# Patient Record
Sex: Female | Born: 1945
Health system: Southern US, Community
[De-identification: ages and names within clinical notes are randomized; demographics above are authoritative.]

## PROBLEM LIST (undated history)

## (undated) DIAGNOSIS — D369 Benign neoplasm, unspecified site: Secondary | ICD-10-CM

## (undated) DIAGNOSIS — F329 Major depressive disorder, single episode, unspecified: Secondary | ICD-10-CM

## (undated) DIAGNOSIS — F32A Depression, unspecified: Secondary | ICD-10-CM

## (undated) DIAGNOSIS — E7800 Pure hypercholesterolemia, unspecified: Secondary | ICD-10-CM

## (undated) DIAGNOSIS — M1990 Unspecified osteoarthritis, unspecified site: Secondary | ICD-10-CM

## (undated) HISTORY — DX: Unspecified osteoarthritis, unspecified site: M19.90

## (undated) HISTORY — DX: Benign neoplasm, unspecified site: D36.9

## (undated) HISTORY — DX: Major depressive disorder, single episode, unspecified: F32.9

## (undated) HISTORY — DX: Pure hypercholesterolemia, unspecified: E78.00

## (undated) HISTORY — DX: Depression, unspecified: F32.A

---

## 2002-02-20 HISTORY — PX: REDUCTION MAMMAPLASTY: SUR839

## 2005-09-25 ENCOUNTER — Encounter: Admission: RE | Admit: 2005-09-25 | Discharge: 2005-09-25 | Payer: Self-pay | Admitting: Geriatric Medicine

## 2006-04-03 ENCOUNTER — Other Ambulatory Visit: Admission: RE | Admit: 2006-04-03 | Discharge: 2006-04-03 | Payer: Self-pay | Admitting: Obstetrics and Gynecology

## 2006-11-15 ENCOUNTER — Encounter: Admission: RE | Admit: 2006-11-15 | Discharge: 2006-11-15 | Payer: Self-pay | Admitting: Geriatric Medicine

## 2007-05-08 ENCOUNTER — Other Ambulatory Visit: Admission: RE | Admit: 2007-05-08 | Discharge: 2007-05-08 | Payer: Self-pay | Admitting: Obstetrics and Gynecology

## 2007-11-18 ENCOUNTER — Encounter: Admission: RE | Admit: 2007-11-18 | Discharge: 2007-11-18 | Payer: Self-pay | Admitting: Geriatric Medicine

## 2008-11-23 ENCOUNTER — Encounter: Admission: RE | Admit: 2008-11-23 | Discharge: 2008-11-23 | Payer: Self-pay | Admitting: Geriatric Medicine

## 2009-04-28 ENCOUNTER — Other Ambulatory Visit
Admission: RE | Admit: 2009-04-28 | Discharge: 2009-04-28 | Payer: Self-pay | Source: Home / Self Care | Admitting: Obstetrics and Gynecology

## 2009-12-09 ENCOUNTER — Encounter: Admission: RE | Admit: 2009-12-09 | Discharge: 2009-12-09 | Payer: Self-pay | Admitting: Geriatric Medicine

## 2010-09-19 ENCOUNTER — Other Ambulatory Visit: Payer: Self-pay | Admitting: Obstetrics and Gynecology

## 2010-09-19 ENCOUNTER — Other Ambulatory Visit (HOSPITAL_COMMUNITY)
Admission: RE | Admit: 2010-09-19 | Discharge: 2010-09-19 | Disposition: A | Discharge status: Home / Self Care | Payer: Medicare Other | Source: Ambulatory Visit | Attending: Obstetrics and Gynecology | Admitting: Obstetrics and Gynecology

## 2010-09-19 DIAGNOSIS — Z124 Encounter for screening for malignant neoplasm of cervix: Secondary | ICD-10-CM | POA: Insufficient documentation

## 2010-11-16 ENCOUNTER — Other Ambulatory Visit: Payer: Self-pay | Admitting: Otolaryngology

## 2010-11-16 DIAGNOSIS — H93299 Other abnormal auditory perceptions, unspecified ear: Secondary | ICD-10-CM

## 2010-11-25 ENCOUNTER — Ambulatory Visit
Admission: RE | Admit: 2010-11-25 | Discharge: 2010-11-25 | Disposition: A | Discharge status: Home / Self Care | Payer: Medicare Other | Source: Ambulatory Visit | Attending: Otolaryngology | Admitting: Otolaryngology

## 2010-11-25 DIAGNOSIS — H93299 Other abnormal auditory perceptions, unspecified ear: Secondary | ICD-10-CM

## 2010-11-25 MED ORDER — GADOBENATE DIMEGLUMINE 529 MG/ML IV SOLN
15.0000 mL | Freq: Once | INTRAVENOUS | Status: AC | PRN
  Administered 2010-11-25: 15 mL via INTRAVENOUS

## 2010-12-08 ENCOUNTER — Other Ambulatory Visit: Payer: Self-pay | Admitting: Dermatology

## 2010-12-19 ENCOUNTER — Other Ambulatory Visit: Payer: Self-pay | Admitting: Geriatric Medicine

## 2010-12-19 DIAGNOSIS — Z1231 Encounter for screening mammogram for malignant neoplasm of breast: Secondary | ICD-10-CM

## 2011-01-25 ENCOUNTER — Ambulatory Visit
Admission: RE | Admit: 2011-01-25 | Discharge: 2011-01-25 | Disposition: A | Discharge status: Home / Self Care | Payer: Medicare Other | Source: Ambulatory Visit | Attending: Geriatric Medicine | Admitting: Geriatric Medicine

## 2011-01-25 DIAGNOSIS — Z1231 Encounter for screening mammogram for malignant neoplasm of breast: Secondary | ICD-10-CM

## 2011-12-20 ENCOUNTER — Other Ambulatory Visit: Payer: Self-pay | Admitting: Geriatric Medicine

## 2011-12-20 DIAGNOSIS — Z1231 Encounter for screening mammogram for malignant neoplasm of breast: Secondary | ICD-10-CM

## 2012-01-26 ENCOUNTER — Ambulatory Visit
Admission: RE | Admit: 2012-01-26 | Discharge: 2012-01-26 | Disposition: A | Discharge status: Home / Self Care | Payer: Medicare Other | Source: Ambulatory Visit | Attending: Geriatric Medicine | Admitting: Geriatric Medicine

## 2012-01-26 DIAGNOSIS — Z1231 Encounter for screening mammogram for malignant neoplasm of breast: Secondary | ICD-10-CM

## 2012-05-02 ENCOUNTER — Other Ambulatory Visit: Payer: Self-pay | Admitting: Dermatology

## 2012-12-11 ENCOUNTER — Other Ambulatory Visit: Payer: Self-pay

## 2012-12-11 DIAGNOSIS — Z1231 Encounter for screening mammogram for malignant neoplasm of breast: Secondary | ICD-10-CM

## 2013-02-06 ENCOUNTER — Ambulatory Visit: Payer: Medicare Other

## 2013-03-20 ENCOUNTER — Ambulatory Visit
Admission: RE | Admit: 2013-03-20 | Discharge: 2013-03-20 | Disposition: A | Discharge status: Home / Self Care | Payer: Medicare Other | Source: Ambulatory Visit

## 2013-03-20 DIAGNOSIS — Z1231 Encounter for screening mammogram for malignant neoplasm of breast: Secondary | ICD-10-CM

## 2014-02-26 ENCOUNTER — Other Ambulatory Visit: Payer: Self-pay

## 2014-02-26 DIAGNOSIS — Z1231 Encounter for screening mammogram for malignant neoplasm of breast: Secondary | ICD-10-CM

## 2014-02-26 DIAGNOSIS — Z9889 Other specified postprocedural states: Secondary | ICD-10-CM

## 2014-03-23 ENCOUNTER — Ambulatory Visit
Admission: RE | Admit: 2014-03-23 | Discharge: 2014-03-23 | Disposition: A | Discharge status: Home / Self Care | Payer: Medicare Other | Source: Ambulatory Visit

## 2014-03-23 DIAGNOSIS — Z9889 Other specified postprocedural states: Secondary | ICD-10-CM

## 2014-03-23 DIAGNOSIS — Z1231 Encounter for screening mammogram for malignant neoplasm of breast: Secondary | ICD-10-CM

## 2014-12-16 ENCOUNTER — Other Ambulatory Visit: Payer: Self-pay | Admitting: Geriatric Medicine

## 2014-12-16 ENCOUNTER — Ambulatory Visit
Admission: RE | Admit: 2014-12-16 | Discharge: 2014-12-16 | Disposition: A | Discharge status: Home / Self Care | Payer: Medicare Other | Source: Ambulatory Visit | Attending: Geriatric Medicine | Admitting: Geriatric Medicine

## 2014-12-16 DIAGNOSIS — M25552 Pain in left hip: Secondary | ICD-10-CM

## 2014-12-16 DIAGNOSIS — M25551 Pain in right hip: Secondary | ICD-10-CM

## 2015-02-16 ENCOUNTER — Other Ambulatory Visit: Payer: Self-pay

## 2015-02-16 DIAGNOSIS — Z1231 Encounter for screening mammogram for malignant neoplasm of breast: Secondary | ICD-10-CM

## 2015-03-24 DIAGNOSIS — E7800 Pure hypercholesterolemia, unspecified: Secondary | ICD-10-CM | POA: Diagnosis not present

## 2015-03-24 DIAGNOSIS — M25512 Pain in left shoulder: Secondary | ICD-10-CM | POA: Diagnosis not present

## 2015-03-24 DIAGNOSIS — F325 Major depressive disorder, single episode, in full remission: Secondary | ICD-10-CM | POA: Diagnosis not present

## 2015-03-25 ENCOUNTER — Ambulatory Visit
Admission: RE | Admit: 2015-03-25 | Discharge: 2015-03-25 | Disposition: A | Discharge status: Home / Self Care | Payer: Medicare Other | Source: Ambulatory Visit

## 2015-03-25 DIAGNOSIS — Z1231 Encounter for screening mammogram for malignant neoplasm of breast: Secondary | ICD-10-CM

## 2015-03-25 DIAGNOSIS — M25512 Pain in left shoulder: Secondary | ICD-10-CM | POA: Diagnosis not present

## 2015-04-01 DIAGNOSIS — M25512 Pain in left shoulder: Secondary | ICD-10-CM | POA: Diagnosis not present

## 2015-04-07 DIAGNOSIS — M7552 Bursitis of left shoulder: Secondary | ICD-10-CM | POA: Diagnosis not present

## 2015-04-07 DIAGNOSIS — M542 Cervicalgia: Secondary | ICD-10-CM | POA: Diagnosis not present

## 2015-04-12 DIAGNOSIS — M2560 Stiffness of unspecified joint, not elsewhere classified: Secondary | ICD-10-CM | POA: Diagnosis not present

## 2015-04-12 DIAGNOSIS — M25512 Pain in left shoulder: Secondary | ICD-10-CM | POA: Diagnosis not present

## 2015-04-12 DIAGNOSIS — M25612 Stiffness of left shoulder, not elsewhere classified: Secondary | ICD-10-CM | POA: Diagnosis not present

## 2015-04-12 DIAGNOSIS — M6281 Muscle weakness (generalized): Secondary | ICD-10-CM | POA: Diagnosis not present

## 2015-04-15 DIAGNOSIS — M2560 Stiffness of unspecified joint, not elsewhere classified: Secondary | ICD-10-CM | POA: Diagnosis not present

## 2015-04-15 DIAGNOSIS — M25512 Pain in left shoulder: Secondary | ICD-10-CM | POA: Diagnosis not present

## 2015-04-15 DIAGNOSIS — M6281 Muscle weakness (generalized): Secondary | ICD-10-CM | POA: Diagnosis not present

## 2015-04-15 DIAGNOSIS — M25612 Stiffness of left shoulder, not elsewhere classified: Secondary | ICD-10-CM | POA: Diagnosis not present

## 2015-04-20 DIAGNOSIS — M6281 Muscle weakness (generalized): Secondary | ICD-10-CM | POA: Diagnosis not present

## 2015-04-20 DIAGNOSIS — M25512 Pain in left shoulder: Secondary | ICD-10-CM | POA: Diagnosis not present

## 2015-04-20 DIAGNOSIS — M25612 Stiffness of left shoulder, not elsewhere classified: Secondary | ICD-10-CM | POA: Diagnosis not present

## 2015-04-20 DIAGNOSIS — M2560 Stiffness of unspecified joint, not elsewhere classified: Secondary | ICD-10-CM | POA: Diagnosis not present

## 2015-04-22 DIAGNOSIS — M25612 Stiffness of left shoulder, not elsewhere classified: Secondary | ICD-10-CM | POA: Diagnosis not present

## 2015-04-22 DIAGNOSIS — M25512 Pain in left shoulder: Secondary | ICD-10-CM | POA: Diagnosis not present

## 2015-04-22 DIAGNOSIS — M2560 Stiffness of unspecified joint, not elsewhere classified: Secondary | ICD-10-CM | POA: Diagnosis not present

## 2015-04-22 DIAGNOSIS — M6281 Muscle weakness (generalized): Secondary | ICD-10-CM | POA: Diagnosis not present

## 2015-05-17 DIAGNOSIS — Z85828 Personal history of other malignant neoplasm of skin: Secondary | ICD-10-CM | POA: Diagnosis not present

## 2015-05-17 DIAGNOSIS — L820 Inflamed seborrheic keratosis: Secondary | ICD-10-CM | POA: Diagnosis not present

## 2015-05-19 DIAGNOSIS — M7542 Impingement syndrome of left shoulder: Secondary | ICD-10-CM | POA: Diagnosis not present

## 2015-05-19 DIAGNOSIS — M542 Cervicalgia: Secondary | ICD-10-CM | POA: Diagnosis not present

## 2015-10-11 DIAGNOSIS — L570 Actinic keratosis: Secondary | ICD-10-CM | POA: Diagnosis not present

## 2015-10-11 DIAGNOSIS — L821 Other seborrheic keratosis: Secondary | ICD-10-CM | POA: Diagnosis not present

## 2015-12-29 DIAGNOSIS — Z79899 Other long term (current) drug therapy: Secondary | ICD-10-CM | POA: Diagnosis not present

## 2015-12-29 DIAGNOSIS — Z23 Encounter for immunization: Secondary | ICD-10-CM | POA: Diagnosis not present

## 2015-12-29 DIAGNOSIS — E7800 Pure hypercholesterolemia, unspecified: Secondary | ICD-10-CM | POA: Diagnosis not present

## 2015-12-29 DIAGNOSIS — G2581 Restless legs syndrome: Secondary | ICD-10-CM | POA: Diagnosis not present

## 2015-12-29 DIAGNOSIS — F325 Major depressive disorder, single episode, in full remission: Secondary | ICD-10-CM | POA: Diagnosis not present

## 2015-12-29 DIAGNOSIS — Z0000 Encounter for general adult medical examination without abnormal findings: Secondary | ICD-10-CM | POA: Diagnosis not present

## 2016-02-23 ENCOUNTER — Other Ambulatory Visit: Payer: Self-pay | Admitting: Geriatric Medicine

## 2016-02-23 DIAGNOSIS — Z9889 Other specified postprocedural states: Secondary | ICD-10-CM

## 2016-02-23 DIAGNOSIS — Z1231 Encounter for screening mammogram for malignant neoplasm of breast: Secondary | ICD-10-CM

## 2016-03-03 DIAGNOSIS — H40053 Ocular hypertension, bilateral: Secondary | ICD-10-CM | POA: Diagnosis not present

## 2016-03-03 DIAGNOSIS — H2513 Age-related nuclear cataract, bilateral: Secondary | ICD-10-CM | POA: Diagnosis not present

## 2016-03-30 ENCOUNTER — Ambulatory Visit
Admission: RE | Admit: 2016-03-30 | Discharge: 2016-03-30 | Disposition: A | Discharge status: Home / Self Care | Payer: Medicare Other | Source: Ambulatory Visit | Attending: Geriatric Medicine | Admitting: Geriatric Medicine

## 2016-03-30 ENCOUNTER — Encounter: Payer: Self-pay | Admitting: Radiology

## 2016-03-30 DIAGNOSIS — Z9889 Other specified postprocedural states: Secondary | ICD-10-CM

## 2016-03-30 DIAGNOSIS — Z1231 Encounter for screening mammogram for malignant neoplasm of breast: Secondary | ICD-10-CM

## 2016-05-16 DIAGNOSIS — L720 Epidermal cyst: Secondary | ICD-10-CM | POA: Diagnosis not present

## 2016-05-16 DIAGNOSIS — L821 Other seborrheic keratosis: Secondary | ICD-10-CM | POA: Diagnosis not present

## 2016-05-16 DIAGNOSIS — D225 Melanocytic nevi of trunk: Secondary | ICD-10-CM | POA: Diagnosis not present

## 2016-05-16 DIAGNOSIS — L814 Other melanin hyperpigmentation: Secondary | ICD-10-CM | POA: Diagnosis not present

## 2016-07-05 DIAGNOSIS — L814 Other melanin hyperpigmentation: Secondary | ICD-10-CM | POA: Diagnosis not present

## 2016-07-05 DIAGNOSIS — L821 Other seborrheic keratosis: Secondary | ICD-10-CM | POA: Diagnosis not present

## 2016-07-05 DIAGNOSIS — L820 Inflamed seborrheic keratosis: Secondary | ICD-10-CM | POA: Diagnosis not present

## 2016-07-05 DIAGNOSIS — Z85828 Personal history of other malignant neoplasm of skin: Secondary | ICD-10-CM | POA: Diagnosis not present

## 2016-12-07 DIAGNOSIS — Z23 Encounter for immunization: Secondary | ICD-10-CM | POA: Diagnosis not present

## 2017-01-08 DIAGNOSIS — G2581 Restless legs syndrome: Secondary | ICD-10-CM | POA: Diagnosis not present

## 2017-01-08 DIAGNOSIS — F325 Major depressive disorder, single episode, in full remission: Secondary | ICD-10-CM | POA: Diagnosis not present

## 2017-01-08 DIAGNOSIS — E7800 Pure hypercholesterolemia, unspecified: Secondary | ICD-10-CM | POA: Diagnosis not present

## 2017-01-08 DIAGNOSIS — Z0000 Encounter for general adult medical examination without abnormal findings: Secondary | ICD-10-CM | POA: Diagnosis not present

## 2017-01-31 ENCOUNTER — Other Ambulatory Visit: Payer: Self-pay | Admitting: Geriatric Medicine

## 2017-01-31 DIAGNOSIS — Z1231 Encounter for screening mammogram for malignant neoplasm of breast: Secondary | ICD-10-CM

## 2017-02-20 DIAGNOSIS — D369 Benign neoplasm, unspecified site: Secondary | ICD-10-CM

## 2017-02-20 HISTORY — DX: Benign neoplasm, unspecified site: D36.9

## 2017-02-28 DIAGNOSIS — Z1211 Encounter for screening for malignant neoplasm of colon: Secondary | ICD-10-CM | POA: Diagnosis not present

## 2017-02-28 DIAGNOSIS — K5730 Diverticulosis of large intestine without perforation or abscess without bleeding: Secondary | ICD-10-CM | POA: Diagnosis not present

## 2017-02-28 DIAGNOSIS — D126 Benign neoplasm of colon, unspecified: Secondary | ICD-10-CM | POA: Diagnosis not present

## 2017-03-05 DIAGNOSIS — H2513 Age-related nuclear cataract, bilateral: Secondary | ICD-10-CM | POA: Diagnosis not present

## 2017-03-05 DIAGNOSIS — H40053 Ocular hypertension, bilateral: Secondary | ICD-10-CM | POA: Diagnosis not present

## 2017-03-06 DIAGNOSIS — Z1211 Encounter for screening for malignant neoplasm of colon: Secondary | ICD-10-CM | POA: Diagnosis not present

## 2017-03-06 DIAGNOSIS — D126 Benign neoplasm of colon, unspecified: Secondary | ICD-10-CM | POA: Diagnosis not present

## 2017-04-03 ENCOUNTER — Ambulatory Visit
Admission: RE | Admit: 2017-04-03 | Discharge: 2017-04-03 | Disposition: A | Discharge status: Home / Self Care | Payer: Medicare Other | Source: Ambulatory Visit | Attending: Geriatric Medicine | Admitting: Geriatric Medicine

## 2017-04-03 DIAGNOSIS — Z1231 Encounter for screening mammogram for malignant neoplasm of breast: Secondary | ICD-10-CM | POA: Diagnosis not present

## 2017-05-01 DIAGNOSIS — H25811 Combined forms of age-related cataract, right eye: Secondary | ICD-10-CM | POA: Diagnosis not present

## 2017-05-01 DIAGNOSIS — H2511 Age-related nuclear cataract, right eye: Secondary | ICD-10-CM | POA: Diagnosis not present

## 2017-05-22 DIAGNOSIS — R001 Bradycardia, unspecified: Secondary | ICD-10-CM | POA: Diagnosis not present

## 2017-05-22 DIAGNOSIS — F325 Major depressive disorder, single episode, in full remission: Secondary | ICD-10-CM | POA: Diagnosis not present

## 2017-05-22 DIAGNOSIS — Z79899 Other long term (current) drug therapy: Secondary | ICD-10-CM | POA: Diagnosis not present

## 2017-05-22 DIAGNOSIS — R9431 Abnormal electrocardiogram [ECG] [EKG]: Secondary | ICD-10-CM | POA: Diagnosis not present

## 2017-06-22 DIAGNOSIS — F325 Major depressive disorder, single episode, in full remission: Secondary | ICD-10-CM | POA: Diagnosis not present

## 2017-06-22 DIAGNOSIS — R001 Bradycardia, unspecified: Secondary | ICD-10-CM | POA: Diagnosis not present

## 2017-06-22 DIAGNOSIS — R9431 Abnormal electrocardiogram [ECG] [EKG]: Secondary | ICD-10-CM | POA: Diagnosis not present

## 2017-06-25 DIAGNOSIS — R002 Palpitations: Secondary | ICD-10-CM | POA: Diagnosis not present

## 2017-06-25 DIAGNOSIS — R9431 Abnormal electrocardiogram [ECG] [EKG]: Secondary | ICD-10-CM | POA: Diagnosis not present

## 2017-06-29 DIAGNOSIS — Z85828 Personal history of other malignant neoplasm of skin: Secondary | ICD-10-CM | POA: Diagnosis not present

## 2017-06-29 DIAGNOSIS — L80 Vitiligo: Secondary | ICD-10-CM | POA: Diagnosis not present

## 2017-06-29 DIAGNOSIS — L570 Actinic keratosis: Secondary | ICD-10-CM | POA: Diagnosis not present

## 2017-07-10 DIAGNOSIS — H25812 Combined forms of age-related cataract, left eye: Secondary | ICD-10-CM | POA: Diagnosis not present

## 2017-07-10 DIAGNOSIS — H2512 Age-related nuclear cataract, left eye: Secondary | ICD-10-CM | POA: Diagnosis not present

## 2017-07-17 DIAGNOSIS — R001 Bradycardia, unspecified: Secondary | ICD-10-CM | POA: Diagnosis not present

## 2017-07-17 DIAGNOSIS — I471 Supraventricular tachycardia: Secondary | ICD-10-CM | POA: Diagnosis not present

## 2017-07-31 ENCOUNTER — Encounter: Payer: Self-pay | Admitting: Internal Medicine

## 2017-07-31 ENCOUNTER — Ambulatory Visit: Payer: Medicare Other | Admitting: Internal Medicine

## 2017-07-31 ENCOUNTER — Encounter (INDEPENDENT_AMBULATORY_CARE_PROVIDER_SITE_OTHER): Payer: Self-pay

## 2017-07-31 VITALS — BP 132/90 | HR 76 | Ht 67.0 in | Wt 167.0 lb

## 2017-07-31 DIAGNOSIS — R55 Syncope and collapse: Secondary | ICD-10-CM | POA: Diagnosis not present

## 2017-07-31 DIAGNOSIS — I472 Ventricular tachycardia: Secondary | ICD-10-CM

## 2017-07-31 DIAGNOSIS — R001 Bradycardia, unspecified: Secondary | ICD-10-CM

## 2017-07-31 DIAGNOSIS — I4729 Other ventricular tachycardia: Secondary | ICD-10-CM

## 2017-07-31 NOTE — Patient Instructions (Signed)
Medication Instructions:  Your physician recommends that you continue on your current medications as directed. Please refer to the Current Medication list given to you today.  Labwork: Pre procedure labwork on 6/17  Testing/Procedures: Your physician has recommended that you have a pacemaker inserted. A pacemaker is a small device that is placed under the skin of your chest or abdomen to help control abnormal heart rhythms. This device uses electrical pulses to prompt the heart to beat at a normal rate. Pacemakers are used to treat heart rhythms that are too slow. Wire (leads) are attached to the pacemaker that goes into the chambers of you heart. This is done in the hospital and usually requires and overnight stay. Please see the instruction sheet given to you today for more information.  Follow-Up: Your physician recommends that you schedule a follow-up appointment in:   10-14 days after implant for wound check 91 days after implant for follow up with Dr Caryl Comes  Any Other Special Instructions Will Be Listed Below (If Applicable).     If you need a refill on your cardiac medications before your next appointment, please call your pharmacy.

## 2017-07-31 NOTE — Progress Notes (Signed)
ELECTROPHYSIOLOGY CONSULT NOTE  Patient ID: Joanna Haas, MRN: [id number], DOB/AGE: 72-Oct-1947 72 y.o. Admit date: (Not on file) Date of Consult: 07/31/2017  Primary Physician: Lajean Manes, MD Primary Cardiologist: new     Joanna Haas is a 72 y.o. female who is being seen today for the evaluation of bradycardia at the request of HStoneking.    HPI Joanna Haas is a 72 y.o. female  Referred because of an abnormal event recorder.  She has a history of recurrent syncope dating back to when she was a girl.  The first occurred during the Christmas play, the second occurred when she was 14 with a prolonged prodrome.  Both of these were associated with diaphoresis and flushing in the recovery phase.  More recently, about 13 years ago she had an episode of syncope at a restaurant with a prolonged prodrome also.  There is also prolonged recovery.  There is no family history of sudden death.  No family history of abrupt onset offset syncope.  She has had problems with orthostatic lightheadedness and some exercise intolerance.  Her max heart rate on her event recorder was 110 bpm        Past Medical History:  Diagnosis Date  . Adenomatous polyp 02/2017   Dr. Paulita Fujita  . Arthritis   . Depression   . Hypercholesterolemia    goal LDL less the 160      Surgical History:  Past Surgical History:  Procedure Laterality Date  . REDUCTION MAMMAPLASTY Bilateral 2004     Home Meds: Prior to Admission medications   Medication Sig Start Date End Date Taking? Authorizing Provider  atorvastatin (LIPITOR) 20 MG tablet Take 20 mg by mouth daily.   Yes [provider]  ibuprofen (ADVIL,MOTRIN) 200 MG tablet Take 200 mg by mouth every 6 (six) hours as needed for headache, mild pain, moderate pain or cramping.   Yes [provider]  ranitidine (ZANTAC) 75 MG tablet Take 75 mg by mouth as needed for heartburn.   Yes [provider]    Allergies:  Allergies    Allergen Reactions  . Calcium + Vitamin D3 [Calcium Carb-Cholecalciferol] Other (See Comments)    Upset stomach  . Citalopram Hydrobromide Other (See Comments)    QT prolonged  . Simvastatin Other (See Comments)    Restless legs    Social History   Socioeconomic History  . Marital status: Married    Spouse name: Not on file  . Number of children: 2  . Years of education: Not on file  . Highest education level: Not on file  Occupational History  . Occupation: Reired  Scientific laboratory technician  . Financial resource strain: Not on file  . Food insecurity:    Worry: Not on file    Inability: Not on file  . Transportation needs:    Medical: Not on file    Non-medical: Not on file  Tobacco Use  . Smoking status: Former Smoker    Last attempt to quit: 1974    Years since quitting: 45.4  . Smokeless tobacco: Never Used  Substance and Sexual Activity  . Alcohol use: Yes    Comment: Glass of wine in evening  . Drug use: Not on file  . Sexual activity: Not on file  Lifestyle  . Physical activity:    Days per week: Not on file    Minutes per session: Not on file  . Stress: Not on file  Relationships  .  Social connections:    Talks on phone: Not on file    Gets together: Not on file    Attends religious service: Not on file    Active member of club or organization: Not on file    Attends meetings of clubs or organizations: Not on file    Relationship status: Not on file  . Intimate partner violence:    Fear of current or ex partner: Not on file    Emotionally abused: Not on file    Physically abused: Not on file    Forced sexual activity: Not on file  Other Topics Concern  . Not on file  Social History Narrative   Moved to [place] from [place], [place].   2 daughters in [place]      Family History  Problem Relation Age of Onset  . Cancer Mother   . Cancer Father   . Cancer Sister   . Cancer Brother      ROS:  Please see the history of present illness.     All other  systems reviewed and negative.    Physical Exam: Blood pressure 132/90, pulse 76, height 5\' 7"  (1.702 m), weight 167 lb (75.8 kg), SpO2 98 %. General: Well developed, well nourished female in no acute distress. Head: Normocephalic, atraumatic, sclera non-icteric, no xanthomas, nares are without discharge. EENT: normal  Lymph Nodes:  none Neck: Negative for carotid bruits. JVD not elevated. Back:without scoliosis kyphosis Lungs: Clear bilaterally to auscultation without wheezes, rales, or rhonchi. Breathing is unlabored. Heart: Slow but RRR with S1 S2. No murmur . No rubs, or gallops appreciated. Abdomen: Soft, non-tender, non-distended with normoactive bowel sounds. No hepatomegaly. No rebound/guarding. No obvious abdominal masses. Msk:  Strength and tone appear normal for age. Extremities: No clubbing or cyanosis. No edema.  Distal pedal pulses are 2+ and equal bilaterally. Skin: Warm and Dry Neuro: Alert and oriented X 3. CN III-XII intact Grossly normal sensory and motor function . Psych:  Responds to questions appropriately with a normal affect.      Labs: Cardiac Enzymes No results for input(s): CKTOTAL, CKMB, TROPONINI in the last 72 hours. CBC No results found for: WBC, HGB, HCT, MCV, PLT PROTIME: No results for input(s): LABPROT, INR in the last 72 hours. Chemistry No results for input(s): NA, K, CL, CO2, BUN, CREATININE, CALCIUM, PROT, BILITOT, ALKPHOS, ALT, AST, GLUCOSE in the last 168 hours.  Invalid input(s): LABALBU Lipids No results found for: CHOL, HDL, LDLCALC, TRIG BNP No results found for: PROBNP Thyroid Function Tests: No results for input(s): TSH, T4TOTAL, T3FREE, THYROIDAB in the last 72 hours.  Invalid input(s): FREET3 Miscellaneous No results found for: DDIMER  Radiology/Studies:  No results found.  EKG: Sinus rhythm at 52 Interval 17/11/48  Event recorder was personally reviewed.  Demonstrated nonsustained ventricular tachycardia-monomorphic  atrial tachycardia.  Maximal heart rate was 110 minimum heart rate was 25.  There are episodes of sinus arrest.  There was also a sequence of polymorphic triplet that was read by ZIO as SVT and initially by me as nonsustained VT.  There is a double deflection in the preceding T wave prior to the triplet.  I suspect this was interpreted by ZIO as the P wave conducting and with aberration.  This may well be correct.  It is complicated however in that the QRS complex Has no preceding P wave in the beat before which has no preceding P waves and describes a P wave in the ST segment i.e. retrograde.  This could give rise to the double deflection noted in the T wave.  Assessment and Plan:  Sinus node dysfunction  Ventricular tachycardia nonsustained-monomorphic  Tachycardia nonsustained-polymorphic possibly ventricular but probably supraventricular  Syncope-neurally mediated  Orthostatic intolerance  Dyspnea on exertion   The patient has symptomatic sinus node dysfunction with profound bradycardia with heart rates into the mid 20s further with evidence of sinus arrest.  She has nonsustained ventricular tachycardia which is monomorphic and not pause dependent.  She has a polymorphic triplet as outlined above in the context of a pause concerning for early afterdepolarizations but probably supraventricular in origin.  We discussed strategies for looking at QT prolongation is an issue.  First and foremost is noted that there is no family history.  She has had abnormal QTs by report but with her bradycardia I do not know whether these were QTCs or QTs.  With her bradycardia which is progressive and exercise intolerance which she is also noted, we have elected to proceed with pacing for relief of symptomatic bradycardia.  The benefits and risks were reviewed including but not limited to death,  perforation, infection, lead dislodgement and device malfunction.  The patient understands agrees and is willing  to proceed.  We will check an echocardiogram given her ventricular tachycardia        Virl Axe

## 2017-07-31 NOTE — H&P (View-Only) (Signed)
ELECTROPHYSIOLOGY CONSULT NOTE  Patient ID: Joanna Haas, MRN: [id number], DOB/AGE: Jul 14, 1945 72 y.o. Admit date: (Not on file) Date of Consult: 07/31/2017  Primary Physician: Lajean Manes, MD Primary Cardiologist: new     CIENA Haas is a 72 y.o. female who is being seen today for the evaluation of bradycardia at the request of HStoneking.    HPI ROCHELLE Haas is a 72 y.o. female  Referred because of an abnormal event recorder.  She has a history of recurrent syncope dating back to when she was a girl.  The first occurred during the Christmas play, the second occurred when she was 14 with a prolonged prodrome.  Both of these were associated with diaphoresis and flushing in the recovery phase.  More recently, about 13 years ago she had an episode of syncope at a restaurant with a prolonged prodrome also.  There is also prolonged recovery.  There is no family history of sudden death.  No family history of abrupt onset offset syncope.  She has had problems with orthostatic lightheadedness and some exercise intolerance.  Her max heart rate on her event recorder was 110 bpm        Past Medical History:  Diagnosis Date  . Adenomatous polyp 02/2017   Dr. Paulita Fujita  . Arthritis   . Depression   . Hypercholesterolemia    goal LDL less the 160      Surgical History:  Past Surgical History:  Procedure Laterality Date  . REDUCTION MAMMAPLASTY Bilateral 2004     Home Meds: Prior to Admission medications   Medication Sig Start Date End Date Taking? Authorizing Provider  atorvastatin (LIPITOR) 20 MG tablet Take 20 mg by mouth daily.   Yes [provider]  ibuprofen (ADVIL,MOTRIN) 200 MG tablet Take 200 mg by mouth every 6 (six) hours as needed for headache, mild pain, moderate pain or cramping.   Yes [provider]  ranitidine (ZANTAC) 75 MG tablet Take 75 mg by mouth as needed for heartburn.   Yes [provider]    Allergies:  Allergies    Allergen Reactions  . Calcium + Vitamin D3 [Calcium Carb-Cholecalciferol] Other (See Comments)    Upset stomach  . Citalopram Hydrobromide Other (See Comments)    QT prolonged  . Simvastatin Other (See Comments)    Restless legs    Social History   Socioeconomic History  . Marital status: Married    Spouse name: Not on file  . Number of children: 2  . Years of education: Not on file  . Highest education level: Not on file  Occupational History  . Occupation: Reired  Scientific laboratory technician  . Financial resource strain: Not on file  . Food insecurity:    Worry: Not on file    Inability: Not on file  . Transportation needs:    Medical: Not on file    Non-medical: Not on file  Tobacco Use  . Smoking status: Former Smoker    Last attempt to quit: 1974    Years since quitting: 45.4  . Smokeless tobacco: Never Used  Substance and Sexual Activity  . Alcohol use: Yes    Comment: Glass of wine in evening  . Drug use: Not on file  . Sexual activity: Not on file  Lifestyle  . Physical activity:    Days per week: Not on file    Minutes per session: Not on file  . Stress: Not on file  Relationships  .  Social connections:    Talks on phone: Not on file    Gets together: Not on file    Attends religious service: Not on file    Active member of club or organization: Not on file    Attends meetings of clubs or organizations: Not on file    Relationship status: Not on file  . Intimate partner violence:    Fear of current or ex partner: Not on file    Emotionally abused: Not on file    Physically abused: Not on file    Forced sexual activity: Not on file  Other Topics Concern  . Not on file  Social History Narrative   Moved to [place] from [place], [place].   2 daughters in [place]      Family History  Problem Relation Age of Onset  . Cancer Mother   . Cancer Father   . Cancer Sister   . Cancer Brother      ROS:  Please see the history of present illness.     All other  systems reviewed and negative.    Physical Exam: Blood pressure 132/90, pulse 76, height 5\' 7"  (1.702 m), weight 167 lb (75.8 kg), SpO2 98 %. General: Well developed, well nourished female in no acute distress. Head: Normocephalic, atraumatic, sclera non-icteric, no xanthomas, nares are without discharge. EENT: normal  Lymph Nodes:  none Neck: Negative for carotid bruits. JVD not elevated. Back:without scoliosis kyphosis Lungs: Clear bilaterally to auscultation without wheezes, rales, or rhonchi. Breathing is unlabored. Heart: Slow but RRR with S1 S2. No murmur . No rubs, or gallops appreciated. Abdomen: Soft, non-tender, non-distended with normoactive bowel sounds. No hepatomegaly. No rebound/guarding. No obvious abdominal masses. Msk:  Strength and tone appear normal for age. Extremities: No clubbing or cyanosis. No edema.  Distal pedal pulses are 2+ and equal bilaterally. Skin: Warm and Dry Neuro: Alert and oriented X 3. CN III-XII intact Grossly normal sensory and motor function . Psych:  Responds to questions appropriately with a normal affect.      Labs: Cardiac Enzymes No results for input(s): CKTOTAL, CKMB, TROPONINI in the last 72 hours. CBC No results found for: WBC, HGB, HCT, MCV, PLT PROTIME: No results for input(s): LABPROT, INR in the last 72 hours. Chemistry No results for input(s): NA, K, CL, CO2, BUN, CREATININE, CALCIUM, PROT, BILITOT, ALKPHOS, ALT, AST, GLUCOSE in the last 168 hours.  Invalid input(s): LABALBU Lipids No results found for: CHOL, HDL, LDLCALC, TRIG BNP No results found for: PROBNP Thyroid Function Tests: No results for input(s): TSH, T4TOTAL, T3FREE, THYROIDAB in the last 72 hours.  Invalid input(s): FREET3 Miscellaneous No results found for: DDIMER  Radiology/Studies:  No results found.  EKG: Sinus rhythm at 52 Interval 17/11/48  Event recorder was personally reviewed.  Demonstrated nonsustained ventricular tachycardia-monomorphic  atrial tachycardia.  Maximal heart rate was 110 minimum heart rate was 25.  There are episodes of sinus arrest.  There was also a sequence of polymorphic triplet that was read by ZIO as SVT and initially by me as nonsustained VT.  There is a double deflection in the preceding T wave prior to the triplet.  I suspect this was interpreted by ZIO as the P wave conducting and with aberration.  This may well be correct.  It is complicated however in that the QRS complex Has no preceding P wave in the beat before which has no preceding P waves and describes a P wave in the ST segment i.e. retrograde.  This could give rise to the double deflection noted in the T wave.  Assessment and Plan:  Sinus node dysfunction  Ventricular tachycardia nonsustained-monomorphic  Tachycardia nonsustained-polymorphic possibly ventricular but probably supraventricular  Syncope-neurally mediated  Orthostatic intolerance  Dyspnea on exertion   The patient has symptomatic sinus node dysfunction with profound bradycardia with heart rates into the mid 20s further with evidence of sinus arrest.  She has nonsustained ventricular tachycardia which is monomorphic and not pause dependent.  She has a polymorphic triplet as outlined above in the context of a pause concerning for early afterdepolarizations but probably supraventricular in origin.  We discussed strategies for looking at QT prolongation is an issue.  First and foremost is noted that there is no family history.  She has had abnormal QTs by report but with her bradycardia I do not know whether these were QTCs or QTs.  With her bradycardia which is progressive and exercise intolerance which she is also noted, we have elected to proceed with pacing for relief of symptomatic bradycardia.  The benefits and risks were reviewed including but not limited to death,  perforation, infection, lead dislodgement and device malfunction.  The patient understands agrees and is willing  to proceed.  We will check an echocardiogram given her ventricular tachycardia        Virl Axe

## 2017-08-01 DIAGNOSIS — R001 Bradycardia, unspecified: Secondary | ICD-10-CM

## 2017-08-01 NOTE — Addendum Note (Signed)
Addended by: Dollene Primrose on: 08/01/2017 07:55 AM   Modules accepted: Orders

## 2017-08-03 ENCOUNTER — Other Ambulatory Visit: Payer: Self-pay

## 2017-08-03 ENCOUNTER — Ambulatory Visit (HOSPITAL_COMMUNITY): Payer: Medicare Other | Attending: Interventional Cardiology

## 2017-08-03 ENCOUNTER — Telehealth: Payer: Self-pay

## 2017-08-03 DIAGNOSIS — I340 Nonrheumatic mitral (valve) insufficiency: Secondary | ICD-10-CM | POA: Diagnosis not present

## 2017-08-03 DIAGNOSIS — R001 Bradycardia, unspecified: Secondary | ICD-10-CM | POA: Insufficient documentation

## 2017-08-03 DIAGNOSIS — R55 Syncope and collapse: Secondary | ICD-10-CM | POA: Diagnosis not present

## 2017-08-03 DIAGNOSIS — E785 Hyperlipidemia, unspecified: Secondary | ICD-10-CM | POA: Insufficient documentation

## 2017-08-03 DIAGNOSIS — Z87891 Personal history of nicotine dependence: Secondary | ICD-10-CM | POA: Diagnosis not present

## 2017-08-03 DIAGNOSIS — I472 Ventricular tachycardia: Secondary | ICD-10-CM | POA: Diagnosis not present

## 2017-08-03 DIAGNOSIS — I4729 Other ventricular tachycardia: Secondary | ICD-10-CM

## 2017-08-03 NOTE — Telephone Encounter (Signed)
Pt informed of her procedure time changing to 12:00pm arrival with 14:00pm procedure. Pt understands she may have a light breakfast before 7:30am. She had no additional questions.

## 2017-08-06 ENCOUNTER — Other Ambulatory Visit: Payer: Self-pay | Admitting: Internal Medicine

## 2017-08-06 ENCOUNTER — Other Ambulatory Visit: Payer: Medicare Other | Admitting: *Deleted

## 2017-08-06 LAB — CBC WITH DIFFERENTIAL/PLATELET
Basophils Absolute: 0 10*3/uL (ref 0.0–0.2)
Basos: 0 %
EOS (ABSOLUTE): 0.1 10*3/uL (ref 0.0–0.4)
EOS: 2 %
HEMATOCRIT: 41.1 % (ref 34.0–46.6)
HEMOGLOBIN: 14.2 g/dL (ref 11.1–15.9)
Immature Grans (Abs): 0 10*3/uL (ref 0.0–0.1)
Immature Granulocytes: 0 %
LYMPHS ABS: 1.7 10*3/uL (ref 0.7–3.1)
Lymphs: 32 %
MCH: 31.8 pg (ref 26.6–33.0)
MCHC: 34.5 g/dL (ref 31.5–35.7)
MCV: 92 fL (ref 79–97)
MONOCYTES: 9 %
Monocytes Absolute: 0.5 10*3/uL (ref 0.1–0.9)
NEUTROS ABS: 3 10*3/uL (ref 1.4–7.0)
Neutrophils: 57 %
Platelets: 228 10*3/uL (ref 150–450)
RBC: 4.46 x10E6/uL (ref 3.77–5.28)
RDW: 13 % (ref 12.3–15.4)
WBC: 5.2 10*3/uL (ref 3.4–10.8)

## 2017-08-07 LAB — BASIC METABOLIC PANEL
BUN/Creatinine Ratio: 17 (ref 12–28)
BUN: 11 mg/dL (ref 8–27)
CALCIUM: 9.5 mg/dL (ref 8.7–10.3)
CO2: 20 mmol/L (ref 20–29)
CREATININE: 0.65 mg/dL (ref 0.57–1.00)
Chloride: 109 mmol/L — ABNORMAL HIGH (ref 96–106)
GFR calc Af Amer: 103 mL/min/{1.73_m2} (ref >59)
GFR, EST NON AFRICAN AMERICAN: 89 mL/min/{1.73_m2} (ref >59)
Glucose: 87 mg/dL (ref 65–99)
Potassium: 4.5 mmol/L (ref 3.5–5.2)
Sodium: 143 mmol/L (ref 134–144)

## 2017-08-13 ENCOUNTER — Encounter (HOSPITAL_COMMUNITY): Payer: Self-pay | Admitting: *Deleted

## 2017-08-13 ENCOUNTER — Ambulatory Visit (HOSPITAL_COMMUNITY)
Admission: RE | Admit: 2017-08-13 | Discharge: 2017-08-14 | Disposition: A | Discharge status: Home / Self Care | Payer: Medicare Other | Source: Ambulatory Visit | Attending: Internal Medicine | Admitting: Internal Medicine

## 2017-08-13 ENCOUNTER — Other Ambulatory Visit: Payer: Self-pay

## 2017-08-13 ENCOUNTER — Encounter (HOSPITAL_COMMUNITY)
Admission: RE | Disposition: A | Discharge status: Home / Self Care | Payer: Self-pay | Source: Ambulatory Visit | Attending: Internal Medicine

## 2017-08-13 DIAGNOSIS — R0609 Other forms of dyspnea: Secondary | ICD-10-CM | POA: Diagnosis not present

## 2017-08-13 DIAGNOSIS — Z79899 Other long term (current) drug therapy: Secondary | ICD-10-CM | POA: Insufficient documentation

## 2017-08-13 DIAGNOSIS — E7800 Pure hypercholesterolemia, unspecified: Secondary | ICD-10-CM | POA: Insufficient documentation

## 2017-08-13 DIAGNOSIS — I495 Sick sinus syndrome: Secondary | ICD-10-CM | POA: Diagnosis not present

## 2017-08-13 DIAGNOSIS — Z9889 Other specified postprocedural states: Secondary | ICD-10-CM | POA: Diagnosis not present

## 2017-08-13 DIAGNOSIS — Z888 Allergy status to other drugs, medicaments and biological substances status: Secondary | ICD-10-CM | POA: Insufficient documentation

## 2017-08-13 DIAGNOSIS — I472 Ventricular tachycardia: Secondary | ICD-10-CM | POA: Insufficient documentation

## 2017-08-13 DIAGNOSIS — M1990 Unspecified osteoarthritis, unspecified site: Secondary | ICD-10-CM | POA: Insufficient documentation

## 2017-08-13 DIAGNOSIS — R000 Tachycardia, unspecified: Secondary | ICD-10-CM

## 2017-08-13 DIAGNOSIS — R55 Syncope and collapse: Secondary | ICD-10-CM | POA: Diagnosis not present

## 2017-08-13 DIAGNOSIS — Z959 Presence of cardiac and vascular implant and graft, unspecified: Secondary | ICD-10-CM

## 2017-08-13 DIAGNOSIS — F329 Major depressive disorder, single episode, unspecified: Secondary | ICD-10-CM | POA: Insufficient documentation

## 2017-08-13 DIAGNOSIS — Z86010 Personal history of colonic polyps: Secondary | ICD-10-CM | POA: Diagnosis not present

## 2017-08-13 DIAGNOSIS — Z87891 Personal history of nicotine dependence: Secondary | ICD-10-CM | POA: Diagnosis not present

## 2017-08-13 DIAGNOSIS — I455 Other specified heart block: Secondary | ICD-10-CM | POA: Insufficient documentation

## 2017-08-13 DIAGNOSIS — R001 Bradycardia, unspecified: Secondary | ICD-10-CM

## 2017-08-13 HISTORY — PX: PACEMAKER IMPLANT: EP1218

## 2017-08-13 LAB — SURGICAL PCR SCREEN
MRSA, PCR: NEGATIVE
STAPHYLOCOCCUS AUREUS: NEGATIVE

## 2017-08-13 SURGERY — PACEMAKER IMPLANT

## 2017-08-13 MED ORDER — LIDOCAINE HCL (PF) 1 % IJ SOLN
INTRAMUSCULAR | Status: DC | PRN
  Administered 2017-08-13: 80 mL

## 2017-08-13 MED ORDER — LIDOCAINE HCL (PF) 1 % IJ SOLN
INTRAMUSCULAR | Status: AC
  Filled 2017-08-13: qty 30

## 2017-08-13 MED ORDER — MIDAZOLAM HCL 5 MG/5ML IJ SOLN
INTRAMUSCULAR | Status: AC
  Filled 2017-08-13: qty 5

## 2017-08-13 MED ORDER — CEFAZOLIN SODIUM-DEXTROSE 2-4 GM/100ML-% IV SOLN
2.0000 g | INTRAVENOUS | Status: AC
  Administered 2017-08-13: 2 g via INTRAVENOUS
  Filled 2017-08-13: qty 100

## 2017-08-13 MED ORDER — ONDANSETRON HCL 4 MG/2ML IJ SOLN: 4.0000 mg | Freq: Four times a day (QID) | INTRAMUSCULAR | Status: DC | PRN

## 2017-08-13 MED ORDER — FENTANYL CITRATE (PF) 100 MCG/2ML IJ SOLN
INTRAMUSCULAR | Status: AC
  Filled 2017-08-13: qty 2

## 2017-08-13 MED ORDER — FENTANYL CITRATE (PF) 100 MCG/2ML IJ SOLN
INTRAMUSCULAR | Status: DC | PRN
  Administered 2017-08-13 (×3): 25 ug via INTRAVENOUS

## 2017-08-13 MED ORDER — FAMOTIDINE 20 MG PO TABS
10.0000 mg | ORAL_TABLET | Freq: Every day | ORAL | Status: DC
  Administered 2017-08-13: 10 mg via ORAL
  Filled 2017-08-13: qty 1

## 2017-08-13 MED ORDER — IBUPROFEN 200 MG PO TABS: 200.0000 mg | ORAL_TABLET | Freq: Four times a day (QID) | ORAL | Status: DC | PRN

## 2017-08-13 MED ORDER — HEPARIN (PORCINE) IN NACL 2-0.9 UNITS/ML
INTRAMUSCULAR | Status: AC | PRN
  Administered 2017-08-13: 500 mL

## 2017-08-13 MED ORDER — IOPAMIDOL (ISOVUE-370) INJECTION 76%
INTRAVENOUS | Status: AC
  Filled 2017-08-13: qty 50

## 2017-08-13 MED ORDER — FLUTICASONE PROPIONATE 50 MCG/ACT NA SUSP
2.0000 | Freq: Every day | NASAL | Status: DC | PRN
  Filled 2017-08-13: qty 16

## 2017-08-13 MED ORDER — MIDAZOLAM HCL 5 MG/5ML IJ SOLN
INTRAMUSCULAR | Status: DC | PRN
  Administered 2017-08-13 (×2): 1 mg via INTRAVENOUS
  Administered 2017-08-13: 2 mg via INTRAVENOUS

## 2017-08-13 MED ORDER — SODIUM CHLORIDE 0.9 % IV SOLN
INTRAVENOUS | Status: DC
  Administered 2017-08-13: 13:00:00 via INTRAVENOUS

## 2017-08-13 MED ORDER — CEFAZOLIN SODIUM-DEXTROSE 2-4 GM/100ML-% IV SOLN
INTRAVENOUS | Status: AC
  Filled 2017-08-13: qty 100

## 2017-08-13 MED ORDER — SODIUM CHLORIDE 0.9 % IV SOLN
INTRAVENOUS | Status: AC
  Administered 2017-08-13: 50 mL/h via INTRAVENOUS

## 2017-08-13 MED ORDER — SODIUM CHLORIDE 0.9 % IV SOLN
INTRAVENOUS | Status: AC
  Filled 2017-08-13: qty 2

## 2017-08-13 MED ORDER — CHLORHEXIDINE GLUCONATE 4 % EX LIQD
60.0000 mL | Freq: Once | CUTANEOUS | Status: DC
  Filled 2017-08-13: qty 60

## 2017-08-13 MED ORDER — MUPIROCIN 2 % EX OINT
1.0000 "application " | TOPICAL_OINTMENT | Freq: Once | CUTANEOUS | Status: AC
  Administered 2017-08-13: 1 via TOPICAL
  Filled 2017-08-13: qty 22

## 2017-08-13 MED ORDER — MUPIROCIN 2 % EX OINT
TOPICAL_OINTMENT | CUTANEOUS | Status: AC
  Filled 2017-08-13: qty 22

## 2017-08-13 MED ORDER — SODIUM CHLORIDE 0.9 % IV SOLN
80.0000 mg | INTRAVENOUS | Status: AC
  Administered 2017-08-13: 80 mg

## 2017-08-13 MED ORDER — ACETAMINOPHEN 325 MG PO TABS: 325.0000 mg | ORAL_TABLET | ORAL | Status: DC | PRN

## 2017-08-13 MED ORDER — ATORVASTATIN CALCIUM 20 MG PO TABS
20.0000 mg | ORAL_TABLET | Freq: Every day | ORAL | Status: DC
  Administered 2017-08-14: 20 mg via ORAL
  Filled 2017-08-13: qty 1

## 2017-08-13 MED ORDER — CEFAZOLIN SODIUM-DEXTROSE 1-4 GM/50ML-% IV SOLN
1.0000 g | Freq: Four times a day (QID) | INTRAVENOUS | Status: AC
  Administered 2017-08-13 – 2017-08-14 (×3): 1 g via INTRAVENOUS
  Filled 2017-08-13 (×3): qty 50

## 2017-08-13 SURGICAL SUPPLY — 12 items
CABLE SURGICAL S-101-97-12 (CABLE) ×2 IMPLANT
CATH RIGHTSITE C315HIS02 (CATHETERS) ×1 IMPLANT
IPG PACE AZUR XT DR MRI W1DR01 (Pacemaker) IMPLANT
LEAD CAPSURE NOVUS 5076-52CM (Lead) ×1 IMPLANT
LEAD SELECT SECURE 3830 383069 (Lead) IMPLANT
PACE AZURE XT DR MRI W1DR01 (Pacemaker) ×2 IMPLANT
PAD DEFIB LIFELINK (PAD) ×2 IMPLANT
SELECT SECURE 3830 383069 (Lead) ×2 IMPLANT
SHEATH CLASSIC 7F (SHEATH) ×2 IMPLANT
SLITTER 6232ADJ (MISCELLANEOUS) ×1 IMPLANT
TRAY PACEMAKER INSERTION (PACKS) ×2 IMPLANT
WIRE HI TORQ VERSACORE-J 145CM (WIRE) ×1 IMPLANT

## 2017-08-13 NOTE — Discharge Instructions (Signed)
° ° °  Supplemental Discharge Instructions for  Pacemaker/Defibrillator Patients  Activity No heavy lifting or vigorous activity with your left/right arm for 6 to 8 weeks.  Do not raise your left/right arm above your head for one week.  Gradually raise your affected arm as drawn below.           __          08/17/17                  08/18/17                  08/19/17                  08/20/17  NO DRIVING for   1 week  ; you may begin driving on   06/26/48  .  WOUND CARE - Keep the wound area clean and dry.   - No bandage is needed on the site.  DO  NOT apply any creams, oils, or ointments to the wound area. - If you notice any drainage or discharge from the wound, any swelling or bruising at the site, or you develop a fever > 101? F after you are discharged home, call the office at once.  Special Instructions - You are still able to use cellular telephones; use the ear opposite the side where you have your pacemaker/defibrillator.  Avoid carrying your cellular phone near your device. - When traveling through airports, show security personnel your identification card to avoid being screened in the metal detectors.  Ask the security personnel to use the hand wand. - Avoid arc welding equipment, TENS units (transcutaneous nerve stimulators).  Call the office for questions about other devices. - Avoid electrical appliances that are in poor condition or are not properly grounded. - Microwave ovens are safe to be near or to operate.

## 2017-08-13 NOTE — Interval H&P Note (Signed)
History and Physical Interval Note:  08/13/2017 2:49 PM  Joanna Haas  has presented today for surgery, with the diagnosis of bradicardia  The various methods of treatment have been discussed with the patient and family. After consideration of risks, benefits and other options for treatment, the patient has consented to  Procedure(s): PACEMAKER IMPLANT (N/A) as a surgical intervention .  The patient's history has been reviewed, patient examined, no change in status, stable for surgery.  I have reviewed the patient's chart and labs.  Questions were answered to the patient's satisfaction.     Virl Axe

## 2017-08-13 NOTE — Discharge Summary (Signed)
ELECTROPHYSIOLOGY PROCEDURE DISCHARGE SUMMARY    Patient ID: Joanna Haas,  MRN: [id number], DOB/AGE: 1945-09-04 72 y.o.  Admit date: 08/13/2017 Discharge date: 08/14/2017  Primary Care Physician: Lajean Manes, MD Electrophysiologist: Caryl Comes  Primary Discharge Diagnosis:  Symptomatic bradycardia status post pacemaker implantation this admission  Secondary Discharge Diagnosis:  1.  Hyperlipidemia 2.  Depression 3.  Arthritis  Allergies  Allergen Reactions  . Calcium + Vitamin D3 [Calcium Carb-Cholecalciferol] Other (See Comments)    Upset stomach  . Citalopram Hydrobromide Other (See Comments)    QT prolonged  . Simvastatin Other (See Comments)    Restless legs     Procedures This Admission:  1.  Implantation of a MDT dual chamber PPM on 08/13/17 by Dr Caryl Comes.  See op note for full details.  There were no immediate post procedure complications. 2.  CXR on 08/14/17 demonstrated no pneumothorax status post device implantation.   Brief HPI: Joanna Haas is a 72 y.o. female was referred to electrophysiology in the outpatient setting for consideration of PPM implantation.  The patient has had symptomatic bradycardia without reversible causes identified.  Risks, benefits, and alternatives to PPM implantation were reviewed with the patient who wished to proceed.   Hospital Course:  The patient was admitted and underwent implantation of a MDT dual chamber pacemaker with details as outlined above. Left chest was without hematoma or ecchymosis.  The device was interrogated and found to be functioning normally.  CXR was obtained and demonstrated no pneumothorax status post device implantation.  Wound care, arm mobility, and restrictions were reviewed with the patient.  The patient was examined and considered stable for discharge to home.    Physical Exam: Vitals:   08/13/17 1805 08/13/17 2011 08/14/17 0019 08/14/17 0453  BP: 140/73 140/85 126/69 137/81  Pulse: 60 60 60 (!) 59    Resp:  18 18 18   Temp:  98.3 F (36.8 C) 98.1 F (36.7 C) 98 F (36.7 C)  TempSrc:  Oral Oral Oral  SpO2:  96% 94% 95%  Weight:    167 lb 6.4 oz (75.9 kg)  Height:        GEN- The patient is well appearing, alert and oriented x 3 today.   HEENT: normocephalic, atraumatic; sclera clear, conjunctiva pink; hearing intact; oropharynx clear; neck supple  Lungs- Clear to ausculation bilaterally, normal work of breathing.  No wheezes, rales, rhonchi Heart- Regular rate and rhythm  GI- soft, non-tender, non-distended, bowel sounds present  Extremities- no clubbing, cyanosis, or edema  MS- no significant deformity or atrophy Skin- warm and dry, no rash or lesion, left chest without hematoma/ecchymosis Psych- euthymic mood, full affect Neuro- strength and sensation are intact   Labs:   Lab Results  Component Value Date   WBC 5.2 08/06/2017   HGB 14.2 08/06/2017   HCT 41.1 08/06/2017   MCV 92 08/06/2017   PLT 228 08/06/2017   No results for input(s): NA, K, CL, CO2, BUN, CREATININE, CALCIUM, PROT, BILITOT, ALKPHOS, ALT, AST, GLUCOSE in the last 168 hours.  Invalid input(s): LABALBU  Discharge Medications:  Allergies as of 08/14/2017      Reactions   Calcium + Vitamin D3 [calcium Carb-cholecalciferol] Other (See Comments)   Upset stomach   Citalopram Hydrobromide Other (See Comments)   QT prolonged   Simvastatin Other (See Comments)   Restless legs      Medication List    TAKE these medications   atorvastatin 20 MG tablet Commonly known as:  LIPITOR Take 20 mg by mouth daily.   fluticasone 50 MCG/ACT nasal spray Commonly known as:  FLONASE Place 2 sprays into both nostrils daily as needed for allergies.   ibuprofen 200 MG tablet Commonly known as:  ADVIL,MOTRIN Take 200 mg by mouth every 6 (six) hours as needed for headache, mild pain, moderate pain or cramping.   ranitidine 75 MG tablet Commonly known as:  ZANTAC Take 75 mg by mouth as needed for heartburn.        Disposition:  Discharge Instructions    Diet - low sodium heart healthy   Complete by:  As directed    Increase activity slowly   Complete by:  As directed      Follow-up Information    [facility] Follow up on 08/27/2017.   Specialty:  Cardiology Why:  at 12noon Contact information: [address] [place]       Deboraha Sprang, MD Follow up on 11/13/2017.   Specialty:  Cardiology Why:  at 1:30PM  Contact information: [address] [address] [phone]           Duration of Discharge Encounter: Greater than 30 minutes including physician time.  Signed, Chanetta Marshall, NP 08/14/2017 7:39 AM

## 2017-08-13 NOTE — Progress Notes (Signed)
1630 Recieved pt post pacemaker placement. Arm sling in place,pt instructed to minimize movement of left arm. Incision site pink and intact OTA. Patient no complaints of any pain. Will monitor accordingly.

## 2017-08-14 ENCOUNTER — Ambulatory Visit (HOSPITAL_COMMUNITY): Payer: Medicare Other

## 2017-08-14 ENCOUNTER — Encounter (HOSPITAL_COMMUNITY): Payer: Self-pay | Admitting: Internal Medicine

## 2017-08-14 DIAGNOSIS — I455 Other specified heart block: Secondary | ICD-10-CM | POA: Diagnosis not present

## 2017-08-14 DIAGNOSIS — R55 Syncope and collapse: Secondary | ICD-10-CM | POA: Diagnosis not present

## 2017-08-14 DIAGNOSIS — Z950 Presence of cardiac pacemaker: Secondary | ICD-10-CM | POA: Diagnosis not present

## 2017-08-14 DIAGNOSIS — Z86010 Personal history of colonic polyps: Secondary | ICD-10-CM | POA: Diagnosis not present

## 2017-08-14 DIAGNOSIS — I472 Ventricular tachycardia: Secondary | ICD-10-CM | POA: Diagnosis not present

## 2017-08-14 DIAGNOSIS — M1990 Unspecified osteoarthritis, unspecified site: Secondary | ICD-10-CM | POA: Diagnosis not present

## 2017-08-14 DIAGNOSIS — I495 Sick sinus syndrome: Secondary | ICD-10-CM

## 2017-08-14 DIAGNOSIS — R0609 Other forms of dyspnea: Secondary | ICD-10-CM | POA: Diagnosis not present

## 2017-08-14 DIAGNOSIS — Z79899 Other long term (current) drug therapy: Secondary | ICD-10-CM | POA: Diagnosis not present

## 2017-08-14 DIAGNOSIS — E7800 Pure hypercholesterolemia, unspecified: Secondary | ICD-10-CM | POA: Diagnosis not present

## 2017-08-14 DIAGNOSIS — Z9889 Other specified postprocedural states: Secondary | ICD-10-CM | POA: Diagnosis not present

## 2017-08-14 DIAGNOSIS — Z87891 Personal history of nicotine dependence: Secondary | ICD-10-CM | POA: Diagnosis not present

## 2017-08-14 DIAGNOSIS — F329 Major depressive disorder, single episode, unspecified: Secondary | ICD-10-CM | POA: Diagnosis not present

## 2017-08-14 DIAGNOSIS — Z888 Allergy status to other drugs, medicaments and biological substances status: Secondary | ICD-10-CM | POA: Diagnosis not present

## 2017-08-20 DIAGNOSIS — Z961 Presence of intraocular lens: Secondary | ICD-10-CM | POA: Diagnosis not present

## 2017-08-27 ENCOUNTER — Ambulatory Visit (INDEPENDENT_AMBULATORY_CARE_PROVIDER_SITE_OTHER): Payer: Medicare Other | Admitting: *Deleted

## 2017-08-27 DIAGNOSIS — I495 Sick sinus syndrome: Secondary | ICD-10-CM

## 2017-08-27 LAB — CUP PACEART INCLINIC DEVICE CHECK
Battery Voltage: 3.2 V
Brady Statistic AP VP Percent: 0.08 %
Brady Statistic AS VP Percent: 0 %
Brady Statistic AS VS Percent: 7.65 %
Brady Statistic RV Percent Paced: 0.08 %
Date Time Interrogation Session: 20190708124432
Implantable Lead Implant Date: 20190625
Implantable Lead Model: 3830
Implantable Lead Model: 5076
Implantable Pulse Generator Implant Date: 20190625
Lead Channel Impedance Value: 323 Ohm
Lead Channel Impedance Value: 361 Ohm
Lead Channel Impedance Value: 494 Ohm
Lead Channel Pacing Threshold Amplitude: 0.5 V
Lead Channel Pacing Threshold Amplitude: 0.5 V
Lead Channel Pacing Threshold Pulse Width: 1 ms
Lead Channel Sensing Intrinsic Amplitude: 0.875 mV
MDC IDC LEAD IMPLANT DT: 20190625
MDC IDC LEAD LOCATION: 753859
MDC IDC LEAD LOCATION: 753860
MDC IDC MSMT BATTERY REMAINING LONGEVITY: 143 mo
MDC IDC MSMT LEADCHNL RA IMPEDANCE VALUE: 437 Ohm
MDC IDC MSMT LEADCHNL RA PACING THRESHOLD PULSEWIDTH: 0.4 ms
MDC IDC MSMT LEADCHNL RA SENSING INTR AMPL: 1.125 mV
MDC IDC MSMT LEADCHNL RV SENSING INTR AMPL: 4.5 mV
MDC IDC MSMT LEADCHNL RV SENSING INTR AMPL: 5.875 mV
MDC IDC SET LEADCHNL RA PACING AMPLITUDE: 3.5 V
MDC IDC SET LEADCHNL RV PACING AMPLITUDE: 3.5 V
MDC IDC SET LEADCHNL RV PACING PULSEWIDTH: 1 ms
MDC IDC SET LEADCHNL RV SENSING SENSITIVITY: 1.2 mV
MDC IDC STAT BRADY AP VS PERCENT: 92.26 %
MDC IDC STAT BRADY RA PERCENT PACED: 92.43 %

## 2017-08-27 NOTE — Progress Notes (Signed)
Wound check appointment. Dermabond removed. Wound without redness or edema. Incision edges approximated, wound well healed. Normal device function. Thresholds, sensing, and impedances consistent with implant measurements. RV His Bundle lead tested VVI 90bpm from 5V to LOC with rhythm strip- non-selective capture noted. Device programmed at 3.5V for extra safety margin until 3 month visit (PW of 47ms in RV). Histogram distribution appropriate for patient and level of activity. No mode switches. 1 brief episode of conducted SVT. Patient educated about wound care, arm mobility, lifting restrictions and Carelink monitoring. ROV with SK 11/13/17.

## 2017-11-13 ENCOUNTER — Encounter: Payer: Self-pay | Admitting: Internal Medicine

## 2017-11-13 ENCOUNTER — Ambulatory Visit (INDEPENDENT_AMBULATORY_CARE_PROVIDER_SITE_OTHER): Payer: Medicare Other | Admitting: Internal Medicine

## 2017-11-13 VITALS — BP 110/86 | HR 68 | Ht 67.0 in | Wt 167.2 lb

## 2017-11-13 DIAGNOSIS — I472 Ventricular tachycardia: Secondary | ICD-10-CM

## 2017-11-13 DIAGNOSIS — R55 Syncope and collapse: Secondary | ICD-10-CM

## 2017-11-13 DIAGNOSIS — I4729 Other ventricular tachycardia: Secondary | ICD-10-CM

## 2017-11-13 DIAGNOSIS — I495 Sick sinus syndrome: Secondary | ICD-10-CM | POA: Diagnosis not present

## 2017-11-13 DIAGNOSIS — Z950 Presence of cardiac pacemaker: Secondary | ICD-10-CM | POA: Diagnosis not present

## 2017-11-13 DIAGNOSIS — L910 Hypertrophic scar: Secondary | ICD-10-CM

## 2017-11-13 NOTE — Patient Instructions (Addendum)
Medication Instructions:  Your physician recommends that you continue on your current medications as directed. Please refer to the Current Medication list given to you today.  Labwork: None ordered.  Testing/Procedures: None ordered.  Follow-Up: Your physician wants you to follow-up in: 9 months with Tommye Standard, PA You will receive a reminder letter in the mail two months in advance. If you don't receive a letter, please call our office to schedule the follow-up appointment.  Remote monitoring is used to monitor your Pacemaker of ICD from home. This monitoring reduces the number of office visits required to check your device to one time per year. It allows [place] to keep an eye on the functioning of your device to ensure it is working properly. You are scheduled for a device check from home on 02/12/2018. You may send your transmission at any time that day. If you have a wireless device, the transmission will be sent automatically. After your physician reviews your transmission, you will receive a postcard with your next transmission date.    Any Other Special Instructions Will Be Listed Below (If Applicable).     If you need a refill on your cardiac medications before your next appointment, please call your pharmacy.

## 2017-11-13 NOTE — Progress Notes (Signed)
,        Patient Care Team: Lajean Manes, MD as PCP - General (Internal Medicine)   HPI  Joanna Haas is a 72 y.o. female Seen in follow-up for pacemaker implanted 6/19 for symptomatic sinus node dysfunction, bradycardia associated but not necessarily dependent ventricular tachycardia with monomorphic and polymorphic beats detected.  DATE TEST EF   9/19 Echo   50-55 %    /       %          Date Cr K  6/19 0.65 4.5        She is much improved with less dyspnea more energy  No chest pain or edema   Records and Results Reviewed   Past Medical History:  Diagnosis Date  . Adenomatous polyp 02/2017   Dr. Paulita Fujita  . Arthritis   . Depression   . Hypercholesterolemia    goal LDL less the 160    Past Surgical History:  Procedure Laterality Date  . PACEMAKER IMPLANT N/A 08/13/2017   Procedure: PACEMAKER IMPLANT;  Surgeon: Deboraha Sprang, MD;  Location: [place] [facility];  Service: Cardiovascular;  Laterality: N/A;  . REDUCTION MAMMAPLASTY Bilateral 2004    No outpatient medications have been marked as taking for the 11/13/17 encounter (Office Visit) with Deboraha Sprang, MD.    Allergies  Allergen Reactions  . Calcium + Vitamin D3 [Calcium Carb-Cholecalciferol] Other (See Comments)    Upset stomach  . Citalopram Hydrobromide Other (See Comments)    QT prolonged  . Simvastatin Other (See Comments)    Restless legs      Review of Systems negative except from HPI and PMH  Physical Exam BP 110/86   Pulse 68   Ht 5\' 7"  (1.702 m)   Wt 167 lb 3.2 oz (75.8 kg)   SpO2 96%   BMI 26.19 kg/m  Well developed and nourished in no acute distress HENT normal Neck supple with JVP-flat Clear Device pocket well healed; without hematoma or erythema.  There is no tethering  Regular rate and rhythm, no murmurs or gallops Abd-soft with active BS No Clubbing cyanosis edema Skin-warm and dry A & Oriented  Grossly normal sensory and motor function  ECG  NSR without replo  ablnormaility  Assessment and  Plan  Sinus node dysfunction  Ventricular tachycardia nonsustained-monomorphic  Tachycardia nonsustained-polymorphic possibly ventricular but probably supraventricular  Syncope-neurally mediated  Orthostatic intolerance  Pacemaker, non selective His Medtronic The patient's device was interrogated and the information was fully reviewed.  The device was reprogrammed to max longevity     Much improved following pacing; good HR excursion   Some VT NS but no polymorphic   No syncope   We spent more than 50% of our >25 min visit in face to face counseling regarding the above    Current medicines are reviewed at length with the patient today .  The patient does not*  have concerns regarding medicines.

## 2017-11-30 LAB — CUP PACEART INCLINIC DEVICE CHECK
Implantable Lead Implant Date: 20190625
Implantable Lead Implant Date: 20190625
Implantable Lead Location: 753860
Implantable Lead Model: 3830
Implantable Lead Model: 5076
Implantable Pulse Generator Implant Date: 20190625
MDC IDC LEAD LOCATION: 753859
MDC IDC SESS DTM: 20191011151122

## 2017-12-06 DIAGNOSIS — Z23 Encounter for immunization: Secondary | ICD-10-CM | POA: Diagnosis not present

## 2018-01-07 DIAGNOSIS — L819 Disorder of pigmentation, unspecified: Secondary | ICD-10-CM | POA: Diagnosis not present

## 2018-01-07 DIAGNOSIS — L570 Actinic keratosis: Secondary | ICD-10-CM | POA: Diagnosis not present

## 2018-01-07 DIAGNOSIS — D485 Neoplasm of uncertain behavior of skin: Secondary | ICD-10-CM | POA: Diagnosis not present

## 2018-01-07 DIAGNOSIS — L820 Inflamed seborrheic keratosis: Secondary | ICD-10-CM | POA: Diagnosis not present

## 2018-01-09 DIAGNOSIS — E7800 Pure hypercholesterolemia, unspecified: Secondary | ICD-10-CM | POA: Diagnosis not present

## 2018-01-09 DIAGNOSIS — F325 Major depressive disorder, single episode, in full remission: Secondary | ICD-10-CM | POA: Diagnosis not present

## 2018-01-09 DIAGNOSIS — Z79899 Other long term (current) drug therapy: Secondary | ICD-10-CM | POA: Diagnosis not present

## 2018-01-09 DIAGNOSIS — Z0000 Encounter for general adult medical examination without abnormal findings: Secondary | ICD-10-CM | POA: Diagnosis not present

## 2018-02-12 ENCOUNTER — Ambulatory Visit (INDEPENDENT_AMBULATORY_CARE_PROVIDER_SITE_OTHER): Payer: Medicare Other

## 2018-02-12 DIAGNOSIS — I495 Sick sinus syndrome: Secondary | ICD-10-CM

## 2018-02-13 LAB — CUP PACEART REMOTE DEVICE CHECK
Brady Statistic AP VS Percent: 98.29 %
Brady Statistic AS VP Percent: 0 %
Date Time Interrogation Session: 20191224014408
Implantable Lead Implant Date: 20190625
Implantable Lead Location: 753860
Implantable Lead Model: 3830
Implantable Lead Model: 5076
Lead Channel Impedance Value: 361 Ohm
Lead Channel Pacing Threshold Amplitude: 0.75 V
Lead Channel Pacing Threshold Pulse Width: 0.4 ms
Lead Channel Sensing Intrinsic Amplitude: 0.625 mV
Lead Channel Sensing Intrinsic Amplitude: 6.75 mV
Lead Channel Sensing Intrinsic Amplitude: 6.75 mV
Lead Channel Setting Pacing Amplitude: 2.5 V
Lead Channel Setting Pacing Pulse Width: 0.4 ms
MDC IDC LEAD IMPLANT DT: 20190625
MDC IDC LEAD LOCATION: 753859
MDC IDC MSMT BATTERY REMAINING LONGEVITY: 161 mo
MDC IDC MSMT BATTERY VOLTAGE: 3.13 V
MDC IDC MSMT LEADCHNL RA IMPEDANCE VALUE: 570 Ohm
MDC IDC MSMT LEADCHNL RA PACING THRESHOLD AMPLITUDE: 0.5 V
MDC IDC MSMT LEADCHNL RA PACING THRESHOLD PULSEWIDTH: 0.4 ms
MDC IDC MSMT LEADCHNL RA SENSING INTR AMPL: 0.625 mV
MDC IDC MSMT LEADCHNL RV IMPEDANCE VALUE: 342 Ohm
MDC IDC MSMT LEADCHNL RV IMPEDANCE VALUE: 456 Ohm
MDC IDC PG IMPLANT DT: 20190625
MDC IDC SET LEADCHNL RA PACING AMPLITUDE: 1.5 V
MDC IDC SET LEADCHNL RV SENSING SENSITIVITY: 1.2 mV
MDC IDC STAT BRADY AP VP PERCENT: 0.38 %
MDC IDC STAT BRADY AS VS PERCENT: 1.32 %
MDC IDC STAT BRADY RA PERCENT PACED: 99.09 %
MDC IDC STAT BRADY RV PERCENT PACED: 0.39 %

## 2018-02-14 NOTE — Progress Notes (Signed)
Remote pacemaker transmission.   

## 2018-04-02 ENCOUNTER — Other Ambulatory Visit: Payer: Self-pay | Admitting: Geriatric Medicine

## 2018-04-02 DIAGNOSIS — Z1231 Encounter for screening mammogram for malignant neoplasm of breast: Secondary | ICD-10-CM

## 2018-04-25 DIAGNOSIS — Z0120 Encounter for dental examination and cleaning without abnormal findings: Secondary | ICD-10-CM | POA: Diagnosis not present

## 2018-05-01 ENCOUNTER — Ambulatory Visit
Admission: RE | Admit: 2018-05-01 | Discharge: 2018-05-01 | Disposition: A | Discharge status: Home / Self Care | Payer: Medicare Other | Source: Ambulatory Visit | Attending: Geriatric Medicine | Admitting: Geriatric Medicine

## 2018-05-01 ENCOUNTER — Other Ambulatory Visit: Payer: Self-pay

## 2018-05-01 DIAGNOSIS — Z1231 Encounter for screening mammogram for malignant neoplasm of breast: Secondary | ICD-10-CM

## 2018-05-14 ENCOUNTER — Other Ambulatory Visit: Payer: Self-pay

## 2018-05-14 ENCOUNTER — Ambulatory Visit (INDEPENDENT_AMBULATORY_CARE_PROVIDER_SITE_OTHER): Payer: Medicare Other | Admitting: *Deleted

## 2018-05-14 DIAGNOSIS — I495 Sick sinus syndrome: Secondary | ICD-10-CM

## 2018-05-14 LAB — CUP PACEART REMOTE DEVICE CHECK
Battery Remaining Longevity: 159 mo
Battery Voltage: 3.08 V
Brady Statistic AP VP Percent: 0.69 %
Brady Statistic AP VS Percent: 98.03 %
Brady Statistic AS VP Percent: 0 %
Brady Statistic RA Percent Paced: 99.43 %
Brady Statistic RV Percent Paced: 0.69 %
Date Time Interrogation Session: 20200324004833
Implantable Lead Implant Date: 20190625
Implantable Lead Implant Date: 20190625
Implantable Lead Location: 753859
Implantable Lead Location: 753860
Implantable Lead Model: 3830
Implantable Pulse Generator Implant Date: 20190625
Lead Channel Impedance Value: 361 Ohm
Lead Channel Impedance Value: 532 Ohm
Lead Channel Impedance Value: 589 Ohm
Lead Channel Pacing Threshold Amplitude: 0.5 V
Lead Channel Pacing Threshold Amplitude: 0.75 V
Lead Channel Pacing Threshold Pulse Width: 0.4 ms
Lead Channel Pacing Threshold Pulse Width: 0.4 ms
Lead Channel Sensing Intrinsic Amplitude: 1.25 mV
Lead Channel Sensing Intrinsic Amplitude: 1.25 mV
Lead Channel Sensing Intrinsic Amplitude: 5.5 mV
Lead Channel Setting Pacing Amplitude: 2.5 V
Lead Channel Setting Pacing Pulse Width: 0.4 ms
Lead Channel Setting Sensing Sensitivity: 1.2 mV
MDC IDC MSMT LEADCHNL RV IMPEDANCE VALUE: 342 Ohm
MDC IDC MSMT LEADCHNL RV SENSING INTR AMPL: 5.5 mV
MDC IDC SET LEADCHNL RA PACING AMPLITUDE: 1.5 V
MDC IDC STAT BRADY AS VS PERCENT: 1.29 %

## 2018-05-20 ENCOUNTER — Encounter: Payer: Self-pay | Admitting: Cardiology

## 2018-05-20 NOTE — Progress Notes (Signed)
Remote pacemaker transmission.   

## 2018-05-27 ENCOUNTER — Telehealth: Payer: Self-pay | Admitting: Physician Assistant

## 2018-05-27 NOTE — Telephone Encounter (Signed)
New Message:   Patient calling concerning a appt with Rena for June and I do not see anything up for her. Please call patient.

## 2018-08-13 ENCOUNTER — Ambulatory Visit (INDEPENDENT_AMBULATORY_CARE_PROVIDER_SITE_OTHER): Payer: Medicare Other | Admitting: *Deleted

## 2018-08-13 DIAGNOSIS — I495 Sick sinus syndrome: Secondary | ICD-10-CM | POA: Diagnosis not present

## 2018-08-13 LAB — CUP PACEART REMOTE DEVICE CHECK
Battery Remaining Longevity: 156 mo
Battery Voltage: 3.05 V
Brady Statistic AP VP Percent: 0.76 %
Brady Statistic AP VS Percent: 98.14 %
Brady Statistic AS VP Percent: 0 %
Brady Statistic AS VS Percent: 1.1 %
Brady Statistic RA Percent Paced: 99.57 %
Brady Statistic RV Percent Paced: 0.76 %
Date Time Interrogation Session: 20200623002602
Implantable Lead Implant Date: 20190625
Implantable Lead Implant Date: 20190625
Implantable Lead Location: 753859
Implantable Lead Location: 753860
Implantable Lead Model: 3830
Implantable Lead Model: 5076
Implantable Pulse Generator Implant Date: 20190625
Lead Channel Impedance Value: 323 Ohm
Lead Channel Impedance Value: 361 Ohm
Lead Channel Impedance Value: 456 Ohm
Lead Channel Impedance Value: 608 Ohm
Lead Channel Pacing Threshold Amplitude: 0.375 V
Lead Channel Pacing Threshold Amplitude: 0.75 V
Lead Channel Pacing Threshold Pulse Width: 0.4 ms
Lead Channel Pacing Threshold Pulse Width: 0.4 ms
Lead Channel Sensing Intrinsic Amplitude: 0.75 mV
Lead Channel Sensing Intrinsic Amplitude: 0.75 mV
Lead Channel Sensing Intrinsic Amplitude: 5.625 mV
Lead Channel Sensing Intrinsic Amplitude: 5.625 mV
Lead Channel Setting Pacing Amplitude: 1.5 V
Lead Channel Setting Pacing Amplitude: 2.5 V
Lead Channel Setting Pacing Pulse Width: 0.4 ms
Lead Channel Setting Sensing Sensitivity: 1.2 mV

## 2018-08-26 NOTE — Progress Notes (Signed)
Remote pacemaker transmission.   

## 2018-08-28 DIAGNOSIS — L578 Other skin changes due to chronic exposure to nonionizing radiation: Secondary | ICD-10-CM | POA: Diagnosis not present

## 2018-08-28 DIAGNOSIS — B079 Viral wart, unspecified: Secondary | ICD-10-CM | POA: Diagnosis not present

## 2018-08-28 DIAGNOSIS — L821 Other seborrheic keratosis: Secondary | ICD-10-CM | POA: Diagnosis not present

## 2018-08-28 DIAGNOSIS — D229 Melanocytic nevi, unspecified: Secondary | ICD-10-CM | POA: Diagnosis not present

## 2018-08-28 DIAGNOSIS — L814 Other melanin hyperpigmentation: Secondary | ICD-10-CM | POA: Diagnosis not present

## 2018-09-24 DIAGNOSIS — L578 Other skin changes due to chronic exposure to nonionizing radiation: Secondary | ICD-10-CM | POA: Diagnosis not present

## 2018-09-25 NOTE — Progress Notes (Signed)
Cardiology Office Note Date:  09/26/2018  Patient ID:  Anastaisa, Wooding 17-Jan-1946, MRN [id number] PCP:  Lajean Manes, MD  Cardiologist/cElectrophysiologist:  Dr. Caryl Comes     Chief Complaint:  annual f/u  History of Present Illness: MASHA ORBACH is a 73 y.o. female with history of HLD, deporession, sinus node dysfunction w/PPM, and Dr. Caryl Comes notes bradycardia associated but not necessarily dependent ventricular tachycardia with monomorphic and polymorphic beats detected, h/o syncope neurally mediated and orthostatic intolerance.  She comes in today to be seen for Dr. Caryl Comes, last seen by him Spt 2019, at that time she was at her 79mo post implant visit and feeling/doing much better with less SOB and more energy He reported  Ventricular tachycardia nonsustained-monomorphic Tachycardia nonsustained-polymorphic possibly ventricular but probably supraventricular  She is doing very well.  Her daughter's family had COVID back in march everyone did very well.  She has not had any symptoms. She feels very well, no CP, palpitations or SOB, no dizziness, near syncope syncope.  No ongonig orthostatic symptoms.  This predated her PPM.  She walks her dog daily without changes to her exertional capacity whichh is good.  No difficulties with her daily living  COVID education/precautions were discussed with the patient today  We also discussed general cardiac health and maintenance.  She has labs annually with her PMD, due in Dec.   Device information MDT dual chamber PPM, RV lead is nonselective HIS position, implanted 08/13/17  Past Medical History:  Diagnosis Date  . Adenomatous polyp 02/2017   Dr. Paulita Fujita  . Arthritis   . Depression   . Hypercholesterolemia    goal LDL less the 160    Past Surgical History:  Procedure Laterality Date  . PACEMAKER IMPLANT N/A 08/13/2017   Procedure: PACEMAKER IMPLANT;  Surgeon: Deboraha Sprang, MD;  Location: [place] [facility];  Service: Cardiovascular;   Laterality: N/A;  . REDUCTION MAMMAPLASTY Bilateral 2004    Current Outpatient Medications  Medication Sig Dispense Refill  . atorvastatin (LIPITOR) 20 MG tablet Take 20 mg by mouth daily.    . fluticasone (FLONASE) 50 MCG/ACT nasal spray Place 2 sprays into both nostrils daily as needed for allergies.    Marland Kitchen ibuprofen (ADVIL,MOTRIN) 200 MG tablet Take 200 mg by mouth every 6 (six) hours as needed for headache, mild pain, moderate pain or cramping.    . ranitidine (ZANTAC) 75 MG tablet Take 75 mg by mouth as needed for heartburn.     No current facility-administered medications for this visit.     Allergies:   Calcium + vitamin d3 [calcium carb-cholecalciferol], Citalopram hydrobromide, and Simvastatin   Social History:  The patient  reports that she quit smoking about 46 years ago. She has never used smokeless tobacco. She reports current alcohol use.   Family History:  The patient's family history includes Cancer in her brother, father, mother, and sister.  ROS:  Please see the history of present illness.   All other systems are reviewed and otherwise negative.   PHYSICAL EXAM:  VS:  BP 110/76   Pulse 70   Ht 5\' 7"  (1.702 m)   Wt 175 lb (79.4 kg)   BMI 27.41 kg/m  BMI: Body mass index is 27.41 kg/m. Well nourished, well developed, in no acute distress  HEENT: normocephalic, atraumatic  Neck: no JVD, carotid bruits or masses Cardiac:  RRR; no significant murmurs, no rubs, or gallops Lungs:  CTA b/l, no wheezing, rhonchi or rales  Abd: soft,  nontender MS: no deformity or atrophy Ext: no edema  Skin: warm and dry, no rash Neuro:  No gross deficits appreciated Psych: euthymic mood, full affect  PPM site is stable, no tethering or discomfort   EKG:  Done today and reviewed by myself shows AP/VS,  70bpm, unchanged  PPM interrogation done today and reviewed by myself; battery and lead measurements are good.   She has had 7 VT episodes, all one second or less EGMs reviewed  all appear NSVT ( when more then [facility] are available, is monomorphic), but one looks an AT Most recent April 2020    08/03/17: TTE Study Conclusions - Left ventricle: The cavity size was normal. Wall thickness was   normal. Systolic function was low normal to mildly reduced. The   estimated ejection fraction was in the range of 50% to 55%. Wall   motion was normal; there were no regional wall motion   abnormalities. Features are consistent with a pseudonormal left   ventricular filling pattern, with concomitant abnormal relaxation   and increased filling pressure (grade 2 diastolic dysfunction). - Aortic valve: There was no stenosis. There was trivial   regurgitation. - Mitral valve: There was mild regurgitation. - Right ventricle: The cavity size was normal. Systolic function   was normal. - Tricuspid valve: Peak RV-RA gradient (S): 24 mm Hg. - Pulmonary arteries: PA peak pressure: 27 mm Hg (S). - Inferior vena cava: The vessel was normal in size. The   respirophasic diameter changes were in the normal range (= 50%),   consistent with normal central venous pressure.  Impressions:  - Normal LV size with EF 50-55%, low normal to mildly reduced.   Moderate diastolic dysfunction. Mild mitral regurgitation. Normal   RV size and systolic function.  Recent Labs: No results found for requested labs within last 8760 hours.  No results found for requested labs within last 8760 hours.   CrCl cannot be calculated (Patient's most recent lab result is older than the maximum 21 days allowed.).   Wt Readings from Last 3 Encounters:  09/26/18 175 lb (79.4 kg)  11/13/17 167 lb 3.2 oz (75.8 kg)  08/14/17 167 lb 6.4 oz (75.9 kg)     Other studies reviewed: Additional studies/records reviewed today include: summarized above  ASSESSMENT AND PLAN:   1. PPM     Intact function, no changes made  2. NSVT     Last back in April, not new, all 1 seond or less     No symptoms, normal LVEF   3. H/o  Syncope     ? Orthostatic     None since PPM implant   Disposition: F/u with remotes Q 3 months, will see her in-clinic in 1 year, sooner if needed  Current medicines are reviewed at length with the patient today.  The patient did not have any concerns regarding medicines.  Venetia Night, PA-C 09/26/2018 10:50 AM     [facility] [address] [place] [address] [phone] (office)  [phone] (fax)

## 2018-09-26 ENCOUNTER — Encounter: Payer: Self-pay | Admitting: Physician Assistant

## 2018-09-26 ENCOUNTER — Ambulatory Visit (INDEPENDENT_AMBULATORY_CARE_PROVIDER_SITE_OTHER): Payer: Medicare Other | Admitting: Physician Assistant

## 2018-09-26 ENCOUNTER — Other Ambulatory Visit: Payer: Self-pay

## 2018-09-26 VITALS — BP 110/76 | HR 70 | Ht 67.0 in | Wt 175.0 lb

## 2018-09-26 DIAGNOSIS — I4729 Other ventricular tachycardia: Secondary | ICD-10-CM

## 2018-09-26 DIAGNOSIS — I472 Ventricular tachycardia: Secondary | ICD-10-CM | POA: Diagnosis not present

## 2018-09-26 DIAGNOSIS — I495 Sick sinus syndrome: Secondary | ICD-10-CM | POA: Diagnosis not present

## 2018-09-26 DIAGNOSIS — Z950 Presence of cardiac pacemaker: Secondary | ICD-10-CM

## 2018-09-26 NOTE — Patient Instructions (Signed)
Medication Instructions:  Your physician recommends that you continue on your current medications as directed. Please refer to the Current Medication list given to you today.  If you need a refill on your cardiac medications before your next appointment, please call your pharmacy.   Lab work:NONE ORDERED  TODAY]  If you have labs (blood work) drawn today and your tests are completely normal, you will receive your results only by: Marland Kitchen MyChart Message (if you have MyChart) OR . A paper copy in the mail If you have any lab test that is abnormal or we need to change your treatment, we will call you to review the results.  Testing/Procedures: NONE ORDERED  TODAY   Follow-Up: At [facility], you and your health needs are our priority.  As part of our continuing mission to provide you with exceptional heart care, we have created designated Provider Care Teams.  These Care Teams include your primary Cardiologist (physician) and Advanced Practice Providers (APPs -  Physician Assistants and Nurse Practitioners) who all work together to provide you with the care you need, when you need it. You will need a follow up appointment in 1 years.  Please call our office 2 months in advance to schedule this appointment.  You may see Dr. Caryl Comes  or one of the following Advanced Practice Providers on your designated Care Team:   Chanetta Marshall, NP . Tommye Standard, PA-C  Any Other Special Instructions Will Be Listed Below (If Applicable).

## 2018-11-13 ENCOUNTER — Ambulatory Visit (INDEPENDENT_AMBULATORY_CARE_PROVIDER_SITE_OTHER): Payer: Medicare Other | Admitting: *Deleted

## 2018-11-13 DIAGNOSIS — I495 Sick sinus syndrome: Secondary | ICD-10-CM | POA: Diagnosis not present

## 2018-11-13 LAB — CUP PACEART REMOTE DEVICE CHECK
Battery Remaining Longevity: 152 mo
Battery Voltage: 3.04 V
Brady Statistic AP VP Percent: 0.74 %
Brady Statistic AP VS Percent: 97.98 %
Brady Statistic AS VP Percent: 0 %
Brady Statistic AS VS Percent: 1.28 %
Brady Statistic RA Percent Paced: 99.38 %
Brady Statistic RV Percent Paced: 0.74 %
Date Time Interrogation Session: 20200923004806
Implantable Lead Implant Date: 20190625
Implantable Lead Implant Date: 20190625
Implantable Lead Location: 753859
Implantable Lead Location: 753860
Implantable Lead Model: 3830
Implantable Lead Model: 5076
Implantable Pulse Generator Implant Date: 20190625
Lead Channel Impedance Value: 361 Ohm
Lead Channel Impedance Value: 380 Ohm
Lead Channel Impedance Value: 456 Ohm
Lead Channel Impedance Value: 551 Ohm
Lead Channel Pacing Threshold Amplitude: 0.5 V
Lead Channel Pacing Threshold Amplitude: 0.75 V
Lead Channel Pacing Threshold Pulse Width: 0.4 ms
Lead Channel Pacing Threshold Pulse Width: 0.4 ms
Lead Channel Sensing Intrinsic Amplitude: 0.75 mV
Lead Channel Sensing Intrinsic Amplitude: 0.75 mV
Lead Channel Sensing Intrinsic Amplitude: 6 mV
Lead Channel Sensing Intrinsic Amplitude: 6 mV
Lead Channel Setting Pacing Amplitude: 1.5 V
Lead Channel Setting Pacing Amplitude: 2.5 V
Lead Channel Setting Pacing Pulse Width: 0.4 ms
Lead Channel Setting Sensing Sensitivity: 1.2 mV

## 2018-11-19 ENCOUNTER — Encounter: Payer: Self-pay | Admitting: Cardiology

## 2018-11-19 DIAGNOSIS — Z0120 Encounter for dental examination and cleaning without abnormal findings: Secondary | ICD-10-CM | POA: Diagnosis not present

## 2018-11-19 NOTE — Progress Notes (Signed)
Remote pacemaker transmission.   

## 2018-11-22 DIAGNOSIS — H40053 Ocular hypertension, bilateral: Secondary | ICD-10-CM | POA: Diagnosis not present

## 2019-01-23 DIAGNOSIS — F325 Major depressive disorder, single episode, in full remission: Secondary | ICD-10-CM | POA: Diagnosis not present

## 2019-01-23 DIAGNOSIS — Z0000 Encounter for general adult medical examination without abnormal findings: Secondary | ICD-10-CM | POA: Diagnosis not present

## 2019-01-23 DIAGNOSIS — Z79899 Other long term (current) drug therapy: Secondary | ICD-10-CM | POA: Diagnosis not present

## 2019-01-23 DIAGNOSIS — E7800 Pure hypercholesterolemia, unspecified: Secondary | ICD-10-CM | POA: Diagnosis not present

## 2019-02-12 ENCOUNTER — Ambulatory Visit (INDEPENDENT_AMBULATORY_CARE_PROVIDER_SITE_OTHER): Payer: Medicare Other | Admitting: *Deleted

## 2019-02-12 DIAGNOSIS — I495 Sick sinus syndrome: Secondary | ICD-10-CM | POA: Diagnosis not present

## 2019-02-13 LAB — CUP PACEART REMOTE DEVICE CHECK
Battery Remaining Longevity: 149 mo
Battery Voltage: 3.03 V
Brady Statistic AP VP Percent: 0.55 %
Brady Statistic AP VS Percent: 98.43 %
Brady Statistic AS VP Percent: 0 %
Brady Statistic AS VS Percent: 1.02 %
Brady Statistic RA Percent Paced: 99.49 %
Brady Statistic RV Percent Paced: 0.55 %
Date Time Interrogation Session: 20201222200940
Implantable Lead Implant Date: 20190625
Implantable Lead Implant Date: 20190625
Implantable Lead Location: 753859
Implantable Lead Location: 753860
Implantable Lead Model: 3830
Implantable Lead Model: 5076
Implantable Pulse Generator Implant Date: 20190625
Lead Channel Impedance Value: 323 Ohm
Lead Channel Impedance Value: 380 Ohm
Lead Channel Impedance Value: 437 Ohm
Lead Channel Impedance Value: 551 Ohm
Lead Channel Pacing Threshold Amplitude: 0.5 V
Lead Channel Pacing Threshold Amplitude: 0.75 V
Lead Channel Pacing Threshold Pulse Width: 0.4 ms
Lead Channel Pacing Threshold Pulse Width: 0.4 ms
Lead Channel Sensing Intrinsic Amplitude: 0.875 mV
Lead Channel Sensing Intrinsic Amplitude: 0.875 mV
Lead Channel Sensing Intrinsic Amplitude: 5 mV
Lead Channel Sensing Intrinsic Amplitude: 5 mV
Lead Channel Setting Pacing Amplitude: 1.5 V
Lead Channel Setting Pacing Amplitude: 2.5 V
Lead Channel Setting Pacing Pulse Width: 0.4 ms
Lead Channel Setting Sensing Sensitivity: 1.2 mV

## 2019-03-18 ENCOUNTER — Other Ambulatory Visit: Payer: Self-pay | Admitting: Geriatric Medicine

## 2019-03-18 DIAGNOSIS — Z1231 Encounter for screening mammogram for malignant neoplasm of breast: Secondary | ICD-10-CM

## 2019-03-27 DIAGNOSIS — L819 Disorder of pigmentation, unspecified: Secondary | ICD-10-CM | POA: Diagnosis not present

## 2019-03-27 DIAGNOSIS — L570 Actinic keratosis: Secondary | ICD-10-CM | POA: Diagnosis not present

## 2019-03-28 ENCOUNTER — Ambulatory Visit: Payer: Medicare Other

## 2019-04-18 DIAGNOSIS — H524 Presbyopia: Secondary | ICD-10-CM | POA: Diagnosis not present

## 2019-04-23 DIAGNOSIS — D485 Neoplasm of uncertain behavior of skin: Secondary | ICD-10-CM | POA: Diagnosis not present

## 2019-04-23 DIAGNOSIS — B079 Viral wart, unspecified: Secondary | ICD-10-CM | POA: Diagnosis not present

## 2019-05-02 ENCOUNTER — Ambulatory Visit
Admission: RE | Admit: 2019-05-02 | Discharge: 2019-05-02 | Disposition: A | Discharge status: Home / Self Care | Payer: Medicare Other | Source: Ambulatory Visit | Attending: Geriatric Medicine | Admitting: Geriatric Medicine

## 2019-05-02 ENCOUNTER — Other Ambulatory Visit: Payer: Self-pay

## 2019-05-02 DIAGNOSIS — Z1231 Encounter for screening mammogram for malignant neoplasm of breast: Secondary | ICD-10-CM | POA: Diagnosis not present

## 2019-05-14 ENCOUNTER — Ambulatory Visit (INDEPENDENT_AMBULATORY_CARE_PROVIDER_SITE_OTHER): Payer: Medicare Other | Admitting: *Deleted

## 2019-05-14 DIAGNOSIS — I495 Sick sinus syndrome: Secondary | ICD-10-CM | POA: Diagnosis not present

## 2019-05-14 LAB — CUP PACEART REMOTE DEVICE CHECK
Battery Remaining Longevity: 145 mo
Battery Voltage: 3.03 V
Brady Statistic AP VP Percent: 0.72 %
Brady Statistic AP VS Percent: 98.19 %
Brady Statistic AS VP Percent: 0 %
Brady Statistic AS VS Percent: 1.09 %
Brady Statistic RA Percent Paced: 99.57 %
Brady Statistic RV Percent Paced: 0.72 %
Date Time Interrogation Session: 20210323201849
Implantable Lead Implant Date: 20190625
Implantable Lead Implant Date: 20190625
Implantable Lead Location: 753859
Implantable Lead Location: 753860
Implantable Lead Model: 3830
Implantable Lead Model: 5076
Implantable Pulse Generator Implant Date: 20190625
Lead Channel Impedance Value: 323 Ohm
Lead Channel Impedance Value: 361 Ohm
Lead Channel Impedance Value: 456 Ohm
Lead Channel Impedance Value: 532 Ohm
Lead Channel Pacing Threshold Amplitude: 0.5 V
Lead Channel Pacing Threshold Amplitude: 0.75 V
Lead Channel Pacing Threshold Pulse Width: 0.4 ms
Lead Channel Pacing Threshold Pulse Width: 0.4 ms
Lead Channel Sensing Intrinsic Amplitude: 1.25 mV
Lead Channel Sensing Intrinsic Amplitude: 1.25 mV
Lead Channel Sensing Intrinsic Amplitude: 4.375 mV
Lead Channel Sensing Intrinsic Amplitude: 4.375 mV
Lead Channel Setting Pacing Amplitude: 1.5 V
Lead Channel Setting Pacing Amplitude: 2.5 V
Lead Channel Setting Pacing Pulse Width: 0.4 ms
Lead Channel Setting Sensing Sensitivity: 1.2 mV

## 2019-05-15 NOTE — Progress Notes (Signed)
PPM Remote  

## 2019-05-22 DIAGNOSIS — E7800 Pure hypercholesterolemia, unspecified: Secondary | ICD-10-CM | POA: Diagnosis not present

## 2019-07-01 DIAGNOSIS — Z0120 Encounter for dental examination and cleaning without abnormal findings: Secondary | ICD-10-CM | POA: Diagnosis not present

## 2019-08-06 DIAGNOSIS — L738 Other specified follicular disorders: Secondary | ICD-10-CM | POA: Diagnosis not present

## 2019-08-06 DIAGNOSIS — C44311 Basal cell carcinoma of skin of nose: Secondary | ICD-10-CM | POA: Diagnosis not present

## 2019-08-06 DIAGNOSIS — D485 Neoplasm of uncertain behavior of skin: Secondary | ICD-10-CM | POA: Diagnosis not present

## 2019-08-13 ENCOUNTER — Ambulatory Visit (INDEPENDENT_AMBULATORY_CARE_PROVIDER_SITE_OTHER): Payer: Medicare Other | Admitting: *Deleted

## 2019-08-13 DIAGNOSIS — I495 Sick sinus syndrome: Secondary | ICD-10-CM

## 2019-08-14 LAB — CUP PACEART REMOTE DEVICE CHECK
Battery Remaining Longevity: 140 mo
Battery Voltage: 3.02 V
Brady Statistic AP VP Percent: 0.54 %
Brady Statistic AP VS Percent: 98.66 %
Brady Statistic AS VP Percent: 0 %
Brady Statistic AS VS Percent: 0.8 %
Brady Statistic RA Percent Paced: 99.66 %
Brady Statistic RV Percent Paced: 0.54 %
Date Time Interrogation Session: 20210624045421
Implantable Lead Implant Date: 20190625
Implantable Lead Implant Date: 20190625
Implantable Lead Location: 753859
Implantable Lead Location: 753860
Implantable Lead Model: 3830
Implantable Lead Model: 5076
Implantable Pulse Generator Implant Date: 20190625
Lead Channel Impedance Value: 323 Ohm
Lead Channel Impedance Value: 361 Ohm
Lead Channel Impedance Value: 437 Ohm
Lead Channel Impedance Value: 456 Ohm
Lead Channel Pacing Threshold Amplitude: 0.375 V
Lead Channel Pacing Threshold Amplitude: 0.75 V
Lead Channel Pacing Threshold Pulse Width: 0.4 ms
Lead Channel Pacing Threshold Pulse Width: 0.4 ms
Lead Channel Sensing Intrinsic Amplitude: 1.125 mV
Lead Channel Sensing Intrinsic Amplitude: 1.125 mV
Lead Channel Sensing Intrinsic Amplitude: 5 mV
Lead Channel Sensing Intrinsic Amplitude: 5 mV
Lead Channel Setting Pacing Amplitude: 1.5 V
Lead Channel Setting Pacing Amplitude: 2.5 V
Lead Channel Setting Pacing Pulse Width: 0.4 ms
Lead Channel Setting Sensing Sensitivity: 1.2 mV

## 2019-08-14 NOTE — Progress Notes (Signed)
Remote pacemaker transmission.   

## 2019-09-02 ENCOUNTER — Telehealth: Payer: Self-pay

## 2019-09-02 NOTE — Telephone Encounter (Signed)
The pt states she got a letter about calling in to make an appointment.  Her phone number is [phone].

## 2019-09-09 DIAGNOSIS — C44311 Basal cell carcinoma of skin of nose: Secondary | ICD-10-CM | POA: Diagnosis not present

## 2019-11-12 ENCOUNTER — Ambulatory Visit (INDEPENDENT_AMBULATORY_CARE_PROVIDER_SITE_OTHER): Payer: Medicare Other | Admitting: *Deleted

## 2019-11-12 DIAGNOSIS — I495 Sick sinus syndrome: Secondary | ICD-10-CM | POA: Diagnosis not present

## 2019-11-12 LAB — CUP PACEART REMOTE DEVICE CHECK
Battery Remaining Longevity: 136 mo
Battery Voltage: 3.02 V
Brady Statistic AP VP Percent: 0.53 %
Brady Statistic AP VS Percent: 98.62 %
Brady Statistic AS VP Percent: 0 %
Brady Statistic AS VS Percent: 0.84 %
Brady Statistic RA Percent Paced: 99.61 %
Brady Statistic RV Percent Paced: 0.53 %
Date Time Interrogation Session: 20210921212801
Implantable Lead Implant Date: 20190625
Implantable Lead Implant Date: 20190625
Implantable Lead Location: 753859
Implantable Lead Location: 753860
Implantable Lead Model: 3830
Implantable Lead Model: 5076
Implantable Pulse Generator Implant Date: 20190625
Lead Channel Impedance Value: 323 Ohm
Lead Channel Impedance Value: 361 Ohm
Lead Channel Impedance Value: 418 Ohm
Lead Channel Impedance Value: 456 Ohm
Lead Channel Pacing Threshold Amplitude: 0.375 V
Lead Channel Pacing Threshold Amplitude: 0.875 V
Lead Channel Pacing Threshold Pulse Width: 0.4 ms
Lead Channel Pacing Threshold Pulse Width: 0.4 ms
Lead Channel Sensing Intrinsic Amplitude: 1.125 mV
Lead Channel Sensing Intrinsic Amplitude: 1.125 mV
Lead Channel Sensing Intrinsic Amplitude: 4.5 mV
Lead Channel Sensing Intrinsic Amplitude: 4.5 mV
Lead Channel Setting Pacing Amplitude: 1.5 V
Lead Channel Setting Pacing Amplitude: 2.5 V
Lead Channel Setting Pacing Pulse Width: 0.4 ms
Lead Channel Setting Sensing Sensitivity: 1.2 mV

## 2019-11-14 NOTE — Progress Notes (Signed)
Remote pacemaker transmission.   

## 2019-11-20 DIAGNOSIS — Z950 Presence of cardiac pacemaker: Secondary | ICD-10-CM | POA: Insufficient documentation

## 2019-11-20 NOTE — Progress Notes (Signed)
, ° ° ° ° ° °  Patient Care Team: Lajean Manes, MD as PCP - General (Internal Medicine)   HPI  Joanna Haas is a 74 y.o. female Seen in follow-up for pacemaker implanted 6/19 for symptomatic sinus node dysfunction, bradycardia associated but not necessarily dependent ventricular tachycardia with monomorphic and polymorphic beats detected.  The patient denies chest pain, shortness of breath, nocturnal dyspnea, orthopnea or peripheral edema.  There have been no palpitations, lightheadedness or syncope.    DATE TEST EF   6/19 Echo   50-55 %          Date Cr K  6/19 0.65 4.5          Records and Results Reviewed   Past Medical History:  Diagnosis Date   Adenomatous polyp 02/2017   Dr. Paulita Fujita   Arthritis    Depression    Hypercholesterolemia    goal LDL less the 160    Past Surgical History:  Procedure Laterality Date   PACEMAKER IMPLANT N/A 08/13/2017   Procedure: PACEMAKER IMPLANT;  Surgeon: Deboraha Sprang, MD;  Location: [place] [facility];  Service: Cardiovascular;  Laterality: N/A;   REDUCTION MAMMAPLASTY Bilateral 2004    Current Meds  Medication Sig   atorvastatin (LIPITOR) 20 MG tablet Take 20 mg by mouth daily.   fluticasone (FLONASE) 50 MCG/ACT nasal spray Place 2 sprays into both nostrils daily as needed for allergies.   ibuprofen (ADVIL,MOTRIN) 200 MG tablet Take 200 mg by mouth every 6 (six) hours as needed for headache, mild pain, moderate pain or cramping.    Allergies  Allergen Reactions   Calcium + Vitamin D3 [Calcium Carb-Cholecalciferol] Other (See Comments)    Upset stomach   Citalopram Hydrobromide Other (See Comments)    QT prolonged   Simvastatin Other (See Comments)    Restless legs      Review of Systems negative except from HPI and PMH  Physical Exam BP 110/82    Pulse 82    Ht 5\' 7"  (1.702 m)    Wt 164 lb 12.8 oz (74.8 kg)    SpO2 96%    BMI 25.81 kg/m  Well developed and well nourished in no acute distress HENT  normal Neck supple with JVP-flat Clear Device pocket well healed; without hematoma or erythema.  There is no tethering  Regular rate and rhythm, no  * murmur Abd-soft with active BS No Clubbing cyanosis   edema Skin-warm and dry A & Oriented  Grossly normal sensory and motor function  ECG A pacing 82 20/14/39   Assessment and  Plan  Sinus node dysfunction  Ventricular tachycardia nonsustained-monomorphic  Tachycardia nonsustained-polymorphic possibly ventricular but probably supraventricular  Syncope-neurally mediated  Orthostatic intolerance  Pacemaker, non selective His Medtronic   Doing well   No OI    No syncope  No VT    Current medicines are reviewed at length with the patient today .  The patient does not*  have concerns regarding medicines.

## 2019-11-21 ENCOUNTER — Ambulatory Visit (INDEPENDENT_AMBULATORY_CARE_PROVIDER_SITE_OTHER): Payer: Medicare Other | Admitting: Internal Medicine

## 2019-11-21 ENCOUNTER — Other Ambulatory Visit: Payer: Self-pay

## 2019-11-21 ENCOUNTER — Encounter: Payer: Self-pay | Admitting: Internal Medicine

## 2019-11-21 VITALS — BP 110/82 | HR 82 | Ht 67.0 in | Wt 164.8 lb

## 2019-11-21 DIAGNOSIS — Z950 Presence of cardiac pacemaker: Secondary | ICD-10-CM

## 2019-11-21 DIAGNOSIS — I425 Other restrictive cardiomyopathy: Secondary | ICD-10-CM

## 2019-11-21 DIAGNOSIS — I495 Sick sinus syndrome: Secondary | ICD-10-CM | POA: Diagnosis not present

## 2019-11-21 NOTE — Patient Instructions (Signed)
Medication Instructions:  Your physician recommends that you continue on your current medications as directed. Please refer to the Current Medication list given to you today.  *If you need a refill on your cardiac medications before your next appointment, please call your pharmacy*   Lab Work: None ordered.  If you have labs (blood work) drawn today and your tests are completely normal, you will receive your results only by: . MyChart Message (if you have MyChart) OR . A paper copy in the mail If you have any lab test that is abnormal or we need to change your treatment, we will call you to review the results.   Testing/Procedures: Your physician has requested that you have an echocardiogram. Echocardiography is a painless test that uses sound waves to create images of your heart. It provides your doctor with information about the size and shape of your heart and how well your heart's chambers and valves are working. This procedure takes approximately one hour. There are no restrictions for this procedure.      Follow-Up: At [facility], you and your health needs are our priority.  As part of our continuing mission to provide you with exceptional heart care, we have created designated Provider Care Teams.  These Care Teams include your primary Cardiologist (physician) and Advanced Practice Providers (APPs -  Physician Assistants and Nurse Practitioners) who all work together to provide you with the care you need, when you need it.  We recommend signing up for the patient portal called "MyChart".  Sign up information is provided on this After Visit Summary.  MyChart is used to connect with patients for Virtual Visits (Telemedicine).  Patients are able to view lab/test results, encounter notes, upcoming appointments, etc.  Non-urgent messages can be sent to your provider as well.   To learn more about what you can do with MyChart, go to [email or web address].    Your next appointment:    12 months  The format for your next appointment:   In Person  Provider:   Steven Klein, MD     

## 2019-12-15 ENCOUNTER — Ambulatory Visit (HOSPITAL_COMMUNITY): Payer: Medicare Other | Attending: Cardiology

## 2019-12-15 ENCOUNTER — Other Ambulatory Visit: Payer: Self-pay

## 2019-12-15 DIAGNOSIS — I425 Other restrictive cardiomyopathy: Secondary | ICD-10-CM | POA: Insufficient documentation

## 2019-12-15 LAB — ECHOCARDIOGRAM COMPLETE
Area-P 1/2: 2.05 cm2
S' Lateral: 2.3 cm

## 2019-12-31 DIAGNOSIS — L820 Inflamed seborrheic keratosis: Secondary | ICD-10-CM | POA: Diagnosis not present

## 2019-12-31 DIAGNOSIS — L905 Scar conditions and fibrosis of skin: Secondary | ICD-10-CM | POA: Diagnosis not present

## 2019-12-31 DIAGNOSIS — L821 Other seborrheic keratosis: Secondary | ICD-10-CM | POA: Diagnosis not present

## 2019-12-31 DIAGNOSIS — D485 Neoplasm of uncertain behavior of skin: Secondary | ICD-10-CM | POA: Diagnosis not present

## 2019-12-31 DIAGNOSIS — L989 Disorder of the skin and subcutaneous tissue, unspecified: Secondary | ICD-10-CM | POA: Diagnosis not present

## 2020-02-05 DIAGNOSIS — E7800 Pure hypercholesterolemia, unspecified: Secondary | ICD-10-CM | POA: Diagnosis not present

## 2020-02-05 DIAGNOSIS — F325 Major depressive disorder, single episode, in full remission: Secondary | ICD-10-CM | POA: Diagnosis not present

## 2020-02-05 DIAGNOSIS — Z1389 Encounter for screening for other disorder: Secondary | ICD-10-CM | POA: Diagnosis not present

## 2020-02-05 DIAGNOSIS — Z0000 Encounter for general adult medical examination without abnormal findings: Secondary | ICD-10-CM | POA: Diagnosis not present

## 2020-02-05 DIAGNOSIS — Z950 Presence of cardiac pacemaker: Secondary | ICD-10-CM | POA: Diagnosis not present

## 2020-02-05 DIAGNOSIS — Z79899 Other long term (current) drug therapy: Secondary | ICD-10-CM | POA: Diagnosis not present

## 2020-02-12 ENCOUNTER — Ambulatory Visit (INDEPENDENT_AMBULATORY_CARE_PROVIDER_SITE_OTHER): Payer: Medicare Other

## 2020-02-12 DIAGNOSIS — I495 Sick sinus syndrome: Secondary | ICD-10-CM

## 2020-02-12 LAB — CUP PACEART REMOTE DEVICE CHECK
Battery Remaining Longevity: 134 mo
Battery Voltage: 3.02 V
Brady Statistic AP VP Percent: 0.66 %
Brady Statistic AP VS Percent: 98.21 %
Brady Statistic AS VP Percent: 0 %
Brady Statistic AS VS Percent: 1.14 %
Brady Statistic RA Percent Paced: 99.58 %
Brady Statistic RV Percent Paced: 0.66 %
Date Time Interrogation Session: 20211222232600
Implantable Lead Implant Date: 20190625
Implantable Lead Implant Date: 20190625
Implantable Lead Location: 753859
Implantable Lead Location: 753860
Implantable Lead Model: 3830
Implantable Lead Model: 5076
Implantable Pulse Generator Implant Date: 20190625
Lead Channel Impedance Value: 323 Ohm
Lead Channel Impedance Value: 380 Ohm
Lead Channel Impedance Value: 437 Ohm
Lead Channel Impedance Value: 494 Ohm
Lead Channel Pacing Threshold Amplitude: 0.375 V
Lead Channel Pacing Threshold Amplitude: 0.875 V
Lead Channel Pacing Threshold Pulse Width: 0.4 ms
Lead Channel Pacing Threshold Pulse Width: 0.4 ms
Lead Channel Sensing Intrinsic Amplitude: 0.75 mV
Lead Channel Sensing Intrinsic Amplitude: 0.75 mV
Lead Channel Sensing Intrinsic Amplitude: 4.625 mV
Lead Channel Sensing Intrinsic Amplitude: 4.625 mV
Lead Channel Setting Pacing Amplitude: 1.5 V
Lead Channel Setting Pacing Amplitude: 2.5 V
Lead Channel Setting Pacing Pulse Width: 0.4 ms
Lead Channel Setting Sensing Sensitivity: 1.2 mV

## 2020-02-26 NOTE — Progress Notes (Signed)
Remote pacemaker transmission.   

## 2020-03-06 ENCOUNTER — Other Ambulatory Visit: Payer: Self-pay | Admitting: Geriatric Medicine

## 2020-03-06 DIAGNOSIS — Z780 Asymptomatic menopausal state: Secondary | ICD-10-CM

## 2020-03-06 DIAGNOSIS — E2839 Other primary ovarian failure: Secondary | ICD-10-CM

## 2020-03-18 ENCOUNTER — Other Ambulatory Visit: Payer: Self-pay | Admitting: Geriatric Medicine

## 2020-03-18 DIAGNOSIS — Z1231 Encounter for screening mammogram for malignant neoplasm of breast: Secondary | ICD-10-CM

## 2020-03-22 DIAGNOSIS — Z79899 Other long term (current) drug therapy: Secondary | ICD-10-CM | POA: Diagnosis not present

## 2020-03-31 ENCOUNTER — Ambulatory Visit
Admission: RE | Admit: 2020-03-31 | Discharge: 2020-03-31 | Disposition: A | Discharge status: Home / Self Care | Payer: Medicare Other | Source: Ambulatory Visit | Attending: Geriatric Medicine | Admitting: Geriatric Medicine

## 2020-03-31 ENCOUNTER — Other Ambulatory Visit: Payer: Self-pay

## 2020-03-31 DIAGNOSIS — Z780 Asymptomatic menopausal state: Secondary | ICD-10-CM | POA: Diagnosis not present

## 2020-03-31 DIAGNOSIS — E2839 Other primary ovarian failure: Secondary | ICD-10-CM

## 2020-03-31 DIAGNOSIS — H5203 Hypermetropia, bilateral: Secondary | ICD-10-CM | POA: Diagnosis not present

## 2020-05-03 ENCOUNTER — Ambulatory Visit: Payer: Medicare Other

## 2020-05-13 ENCOUNTER — Ambulatory Visit (INDEPENDENT_AMBULATORY_CARE_PROVIDER_SITE_OTHER): Payer: Medicare Other

## 2020-05-13 DIAGNOSIS — I495 Sick sinus syndrome: Secondary | ICD-10-CM

## 2020-05-13 LAB — CUP PACEART REMOTE DEVICE CHECK
Battery Remaining Longevity: 131 mo
Battery Voltage: 3.01 V
Brady Statistic AP VP Percent: 0.57 %
Brady Statistic AP VS Percent: 98.48 %
Brady Statistic AS VP Percent: 0 %
Brady Statistic AS VS Percent: 0.95 %
Brady Statistic RA Percent Paced: 99.65 %
Brady Statistic RV Percent Paced: 0.57 %
Date Time Interrogation Session: 20220323214541
Implantable Lead Implant Date: 20190625
Implantable Lead Implant Date: 20190625
Implantable Lead Location: 753859
Implantable Lead Location: 753860
Implantable Lead Model: 3830
Implantable Lead Model: 5076
Implantable Pulse Generator Implant Date: 20190625
Lead Channel Impedance Value: 323 Ohm
Lead Channel Impedance Value: 380 Ohm
Lead Channel Impedance Value: 437 Ohm
Lead Channel Impedance Value: 475 Ohm
Lead Channel Pacing Threshold Amplitude: 0.375 V
Lead Channel Pacing Threshold Amplitude: 0.875 V
Lead Channel Pacing Threshold Pulse Width: 0.4 ms
Lead Channel Pacing Threshold Pulse Width: 0.4 ms
Lead Channel Sensing Intrinsic Amplitude: 0.875 mV
Lead Channel Sensing Intrinsic Amplitude: 0.875 mV
Lead Channel Sensing Intrinsic Amplitude: 4.625 mV
Lead Channel Sensing Intrinsic Amplitude: 4.625 mV
Lead Channel Setting Pacing Amplitude: 1.5 V
Lead Channel Setting Pacing Amplitude: 2.5 V
Lead Channel Setting Pacing Pulse Width: 0.4 ms
Lead Channel Setting Sensing Sensitivity: 1.2 mV

## 2020-05-14 ENCOUNTER — Ambulatory Visit
Admission: RE | Admit: 2020-05-14 | Discharge: 2020-05-14 | Disposition: A | Discharge status: Home / Self Care | Payer: Medicare Other | Source: Ambulatory Visit | Attending: Geriatric Medicine | Admitting: Geriatric Medicine

## 2020-05-14 ENCOUNTER — Other Ambulatory Visit: Payer: Self-pay

## 2020-05-14 DIAGNOSIS — Z1231 Encounter for screening mammogram for malignant neoplasm of breast: Secondary | ICD-10-CM | POA: Diagnosis not present

## 2020-05-25 NOTE — Progress Notes (Signed)
Remote pacemaker transmission.   

## 2020-07-05 DIAGNOSIS — L570 Actinic keratosis: Secondary | ICD-10-CM | POA: Diagnosis not present

## 2020-07-05 DIAGNOSIS — L905 Scar conditions and fibrosis of skin: Secondary | ICD-10-CM | POA: Diagnosis not present

## 2020-07-05 DIAGNOSIS — Z85828 Personal history of other malignant neoplasm of skin: Secondary | ICD-10-CM | POA: Diagnosis not present

## 2020-07-05 DIAGNOSIS — I788 Other diseases of capillaries: Secondary | ICD-10-CM | POA: Diagnosis not present

## 2020-07-05 DIAGNOSIS — L708 Other acne: Secondary | ICD-10-CM | POA: Diagnosis not present

## 2020-07-09 DIAGNOSIS — L570 Actinic keratosis: Secondary | ICD-10-CM | POA: Diagnosis not present

## 2020-07-30 ENCOUNTER — Other Ambulatory Visit: Payer: Medicare Other

## 2020-08-12 ENCOUNTER — Ambulatory Visit (INDEPENDENT_AMBULATORY_CARE_PROVIDER_SITE_OTHER): Payer: Medicare Other

## 2020-08-12 DIAGNOSIS — I495 Sick sinus syndrome: Secondary | ICD-10-CM | POA: Diagnosis not present

## 2020-08-12 LAB — CUP PACEART REMOTE DEVICE CHECK
Battery Remaining Longevity: 126 mo
Battery Voltage: 3.01 V
Brady Statistic AP VP Percent: 0.43 %
Brady Statistic AP VS Percent: 98.66 %
Brady Statistic AS VP Percent: 0 %
Brady Statistic AS VS Percent: 0.91 %
Brady Statistic RA Percent Paced: 99.57 %
Brady Statistic RV Percent Paced: 0.43 %
Date Time Interrogation Session: 20220622223845
Implantable Lead Implant Date: 20190625
Implantable Lead Implant Date: 20190625
Implantable Lead Location: 753859
Implantable Lead Location: 753860
Implantable Lead Model: 3830
Implantable Lead Model: 5076
Implantable Pulse Generator Implant Date: 20190625
Lead Channel Impedance Value: 323 Ohm
Lead Channel Impedance Value: 342 Ohm
Lead Channel Impedance Value: 418 Ohm
Lead Channel Impedance Value: 437 Ohm
Lead Channel Pacing Threshold Amplitude: 0.375 V
Lead Channel Pacing Threshold Amplitude: 1.125 V
Lead Channel Pacing Threshold Pulse Width: 0.4 ms
Lead Channel Pacing Threshold Pulse Width: 0.4 ms
Lead Channel Sensing Intrinsic Amplitude: 1.625 mV
Lead Channel Sensing Intrinsic Amplitude: 1.625 mV
Lead Channel Sensing Intrinsic Amplitude: 4.125 mV
Lead Channel Sensing Intrinsic Amplitude: 4.125 mV
Lead Channel Setting Pacing Amplitude: 1.5 V
Lead Channel Setting Pacing Amplitude: 2.5 V
Lead Channel Setting Pacing Pulse Width: 0.4 ms
Lead Channel Setting Sensing Sensitivity: 1.2 mV

## 2020-08-31 NOTE — Progress Notes (Signed)
Remote pacemaker transmission.   

## 2020-11-10 LAB — CUP PACEART REMOTE DEVICE CHECK
Battery Remaining Longevity: 124 mo
Battery Voltage: 3.01 V
Brady Statistic AP VP Percent: 0.51 %
Brady Statistic AP VS Percent: 98.42 %
Brady Statistic AS VP Percent: 0 %
Brady Statistic AS VS Percent: 1.07 %
Brady Statistic RA Percent Paced: 99.44 %
Brady Statistic RV Percent Paced: 0.51 %
Date Time Interrogation Session: 20220921220226
Implantable Lead Implant Date: 20190625
Implantable Lead Implant Date: 20190625
Implantable Lead Location: 753859
Implantable Lead Location: 753860
Implantable Lead Model: 3830
Implantable Lead Model: 5076
Implantable Pulse Generator Implant Date: 20190625
Lead Channel Impedance Value: 323 Ohm
Lead Channel Impedance Value: 342 Ohm
Lead Channel Impedance Value: 437 Ohm
Lead Channel Impedance Value: 456 Ohm
Lead Channel Pacing Threshold Amplitude: 0.375 V
Lead Channel Pacing Threshold Amplitude: 1.125 V
Lead Channel Pacing Threshold Pulse Width: 0.4 ms
Lead Channel Pacing Threshold Pulse Width: 0.4 ms
Lead Channel Sensing Intrinsic Amplitude: 0.75 mV
Lead Channel Sensing Intrinsic Amplitude: 0.75 mV
Lead Channel Sensing Intrinsic Amplitude: 4.875 mV
Lead Channel Sensing Intrinsic Amplitude: 4.875 mV
Lead Channel Setting Pacing Amplitude: 1.5 V
Lead Channel Setting Pacing Amplitude: 2.5 V
Lead Channel Setting Pacing Pulse Width: 0.4 ms
Lead Channel Setting Sensing Sensitivity: 1.2 mV

## 2020-11-11 ENCOUNTER — Ambulatory Visit (INDEPENDENT_AMBULATORY_CARE_PROVIDER_SITE_OTHER): Payer: Medicare Other

## 2020-11-11 DIAGNOSIS — I495 Sick sinus syndrome: Secondary | ICD-10-CM | POA: Diagnosis not present

## 2020-11-18 NOTE — Progress Notes (Signed)
Remote pacemaker transmission.   

## 2020-11-22 ENCOUNTER — Encounter: Payer: Medicare Other | Admitting: Internal Medicine

## 2020-11-22 ENCOUNTER — Other Ambulatory Visit: Payer: Self-pay

## 2020-11-22 DIAGNOSIS — Z950 Presence of cardiac pacemaker: Secondary | ICD-10-CM

## 2020-11-22 DIAGNOSIS — I495 Sick sinus syndrome: Secondary | ICD-10-CM

## 2020-11-22 NOTE — Progress Notes (Incomplete)
, ° ° ° ° ° °  Patient Care Team: Lajean Manes, MD as PCP - General (Internal Medicine)   HPI  Joanna Haas is a 75 y.o. female Seen in follow-up for pacemaker implanted 6/19 for symptomatic sinus node dysfunction, bradycardia associated but not necessarily dependent ventricular tachycardia with monomorphic and polymorphic beats detected.  The patient denies chest pain, shortness of breath, nocturnal dyspnea, orthopnea or peripheral edema.  There have been no palpitations, lightheadedness or syncope.     The patient denies chest pain***, shortness of breath***, nocturnal dyspnea***, orthopnea*** or peripheral edema***.  There have been no palpitations***, lightheadedness*** or syncope***.  Complains of ***.   DATE TEST EF   6/19 Echo   50-55 %   10/21 Echo  60-65 %     Date Cr K Hgb  6/19 0.65 4.5 14.2    Records and Results Reviewed   Past Medical History:  Diagnosis Date   Adenomatous polyp 02/2017   Dr. Paulita Fujita   Arthritis    Depression    Hypercholesterolemia    goal LDL less the 160    Past Surgical History:  Procedure Laterality Date   PACEMAKER IMPLANT N/A 08/13/2017   Procedure: PACEMAKER IMPLANT;  Surgeon: Deboraha Sprang, MD;  Location: [place] [facility];  Service: Cardiovascular;  Laterality: N/A;   REDUCTION MAMMAPLASTY Bilateral 2004    No outpatient medications have been marked as taking for the 11/22/20 encounter (Appointment) with Deboraha Sprang, MD.    Allergies  Allergen Reactions   Calcium + Vitamin D3 [Calcium Carb-Cholecalciferol] Other (See Comments)    Upset stomach   Citalopram Hydrobromide Other (See Comments)    QT prolonged   Simvastatin Other (See Comments)    Restless legs      Review of Systems negative except from HPI and PMH  Physical Exam There were no vitals taken for this visit. Well developed and well nourished in no acute distress HENT normal Neck supple with JVP-flat Clear Device pocket well healed; without hematoma  or erythema.  There is no tethering  Regular rate and rhythm, no *** gallop No ***/*** murmur Abd-soft with active BS No Clubbing cyanosis *** edema Skin-warm and dry A & Oriented  Grossly normal sensory and motor function  ECG ***   Assessment and  Plan  Sinus node dysfunction   Ventricular tachycardia nonsustained-monomorphic   Tachycardia nonsustained-polymorphic possibly ventricular but probably supraventricular   Syncope-neurally mediated   Orthostatic intolerance  Pacemaker, non selective His Medtronic    Doing well   No OI    No syncope  No VT    Current medicines are reviewed at length with the patient today .  The patient does not*  have concerns regarding medicines.  I,Mathew Stumpf,acting as a scribe for Virl Axe, MD.,have documented all relevant documentation on the behalf of Virl Axe, MD,as directed by  Virl Axe, MD while in the presence of Virl Axe, MD.  ***

## 2020-12-31 DIAGNOSIS — L814 Other melanin hyperpigmentation: Secondary | ICD-10-CM | POA: Diagnosis not present

## 2020-12-31 DIAGNOSIS — L738 Other specified follicular disorders: Secondary | ICD-10-CM | POA: Diagnosis not present

## 2020-12-31 DIAGNOSIS — L821 Other seborrheic keratosis: Secondary | ICD-10-CM | POA: Diagnosis not present

## 2020-12-31 DIAGNOSIS — Z08 Encounter for follow-up examination after completed treatment for malignant neoplasm: Secondary | ICD-10-CM | POA: Diagnosis not present

## 2021-02-10 ENCOUNTER — Ambulatory Visit (INDEPENDENT_AMBULATORY_CARE_PROVIDER_SITE_OTHER): Payer: Medicare Other

## 2021-02-10 DIAGNOSIS — I495 Sick sinus syndrome: Secondary | ICD-10-CM

## 2021-02-10 LAB — CUP PACEART REMOTE DEVICE CHECK
Battery Remaining Longevity: 123 mo
Battery Voltage: 3.01 V
Brady Statistic AP VP Percent: 0.81 %
Brady Statistic AP VS Percent: 98.1 %
Brady Statistic AS VP Percent: 0 %
Brady Statistic AS VS Percent: 1.09 %
Brady Statistic RA Percent Paced: 99.57 %
Brady Statistic RV Percent Paced: 0.81 %
Date Time Interrogation Session: 20221222000127
Implantable Lead Implant Date: 20190625
Implantable Lead Implant Date: 20190625
Implantable Lead Location: 753859
Implantable Lead Location: 753860
Implantable Lead Model: 3830
Implantable Lead Model: 5076
Implantable Pulse Generator Implant Date: 20190625
Lead Channel Impedance Value: 323 Ohm
Lead Channel Impedance Value: 380 Ohm
Lead Channel Impedance Value: 418 Ohm
Lead Channel Impedance Value: 513 Ohm
Lead Channel Pacing Threshold Amplitude: 0.25 V
Lead Channel Pacing Threshold Amplitude: 0.875 V
Lead Channel Pacing Threshold Pulse Width: 0.4 ms
Lead Channel Pacing Threshold Pulse Width: 0.4 ms
Lead Channel Sensing Intrinsic Amplitude: 1 mV
Lead Channel Sensing Intrinsic Amplitude: 1 mV
Lead Channel Sensing Intrinsic Amplitude: 4.75 mV
Lead Channel Sensing Intrinsic Amplitude: 4.75 mV
Lead Channel Setting Pacing Amplitude: 1.5 V
Lead Channel Setting Pacing Amplitude: 2.5 V
Lead Channel Setting Pacing Pulse Width: 0.4 ms
Lead Channel Setting Sensing Sensitivity: 1.2 mV

## 2021-02-18 ENCOUNTER — Encounter: Payer: Medicare Other | Admitting: Internal Medicine

## 2021-02-22 ENCOUNTER — Other Ambulatory Visit: Payer: Self-pay

## 2021-02-22 ENCOUNTER — Ambulatory Visit: Payer: Medicare Other | Admitting: Internal Medicine

## 2021-02-22 ENCOUNTER — Encounter: Payer: Self-pay | Admitting: Internal Medicine

## 2021-02-22 VITALS — BP 134/72 | HR 72 | Ht 67.0 in | Wt 171.0 lb

## 2021-02-22 DIAGNOSIS — I495 Sick sinus syndrome: Secondary | ICD-10-CM

## 2021-02-22 DIAGNOSIS — Z950 Presence of cardiac pacemaker: Secondary | ICD-10-CM | POA: Diagnosis not present

## 2021-02-22 NOTE — Progress Notes (Signed)
Remote pacemaker transmission.   

## 2021-02-22 NOTE — Progress Notes (Signed)
,      Patient Care Team: Lajean Manes, MD as PCP - General (Internal Medicine)   HPI  Joanna Haas is a 76 y.o. female seen in follow-up for pacemaker implanted 6/19 for symptomatic sinus node dysfunction, bradycardia associated but not necessarily dependent ventricular tachycardia with monomorphic and polymorphic beats detected.    10/21 Echo 60-65%   6/19 Echo   50-55 %   10/21 Echo  60-65%     Date Cr K Hgb  6/19 0.65 4.5 14.2   12/21 0.73 4.4 15         Today, she says she is fine. She reports some dyspnea on stairs.  With more vehement and almost does not like to exercise. She walks her dog everyday for a mile to stay active.  But she says mostly the dog walks for  The patient denies chest pain, nocturnal dyspnea, orthopnea or peripheral edema.  There have been no palpitations, lightheadedness or syncope.  Complains of shortness of breath.   Past Medical History:  Diagnosis Date   Adenomatous polyp 02/2017   Dr. Paulita Fujita   Arthritis    Depression    Hypercholesterolemia    goal LDL less the 160    Past Surgical History:  Procedure Laterality Date   PACEMAKER IMPLANT N/A 08/13/2017   Procedure: PACEMAKER IMPLANT;  Surgeon: Deboraha Sprang, MD;  Location: [facility];  Service: Cardiovascular;  Laterality: N/A;   REDUCTION MAMMAPLASTY Bilateral 2004    Current Meds  Medication Sig   atorvastatin (LIPITOR) 20 MG tablet Take 20 mg by mouth daily.   ibuprofen (ADVIL,MOTRIN) 200 MG tablet Take 200 mg by mouth every 6 (six) hours as needed for headache, mild pain, moderate pain or cramping.    Allergies  Allergen Reactions   Calcium + Vitamin D3 [Calcium Carb-Cholecalciferol] Other (See Comments)    Upset stomach   Citalopram Hydrobromide Other (See Comments)    QT prolonged   Simvastatin Other (See Comments)    Restless legs     Review of Systems negative except from HPI and PMH Please see the history of present illness. (+) Shortness of  Breath (+)  All other systems are reviewed and negative.     Physical Exam BP 134/72    Pulse 72    Ht 5\' 7"  (1.702 m)    Wt 171 lb (77.6 kg)    SpO2 94%    BMI 26.78 kg/m  Well developed and well nourished in no acute distress HENT normal Neck supple with JVP-flat Clear Device pocket well healed; without hematoma or erythema.  There is no tethering  Regular rate and rhythm, no murmur Abd-soft with active BS No Clubbing cyanosis   edema Skin-warm and dry A & Oriented  Grossly normal sensory and motor function    02/22/2021 ECG: AV pacing at 72 Intervals 18/12/40  11/21/2019 ECG: A pacing 82 20/14/39     Assessment and  Plan  Sinus node dysfunction   Ventricular tachycardia nonsustained-monomorphic   Tachycardia nonsustained-polymorphic possibly ventricular but probably supraventricular   Syncope-neurally mediated   Orthostatic intolerance  Pacemaker, non selective His Medtronic   Device function is normal heart rate excursion adequate.  She is not interested in being more fit than she is still heart rates greater than 90-95 are probably not necessary.  No interval syncope.  No orthostatic intolerance.  No tachyarrhythmias.   Current medicines are reviewed at length with the patient today .  The patient does not*  have concerns regarding medicines.   F/U in  12 months I,Tinashe Williams,acting as a scribe for Virl Axe, MD.,have documented all relevant documentation on the behalf of Virl Axe, MD,as directed by  Virl Axe, MD while in the presence of Virl Axe, MD.   I, Virl Axe, MD, have reviewed all documentation for this visit. The documentation on 02/22/21 for the exam, diagnosis, procedures, and orders are all accurate and complete.

## 2021-02-22 NOTE — Patient Instructions (Signed)

## 2021-02-22 NOTE — Progress Notes (Deleted)
, ° ° ° ° ° °  Patient Care Team: Lajean Manes, MD as PCP - General (Internal Medicine)   HPI  Joanna Haas is a 76 y.o. female Seen in follow-up for pacemaker implanted 6/19 for symptomatic sinus node dysfunction, bradycardia associated but not necessarily dependent ventricular tachycardia with monomorphic and polymorphic beats detected.  The patient denies chest pain, shortness of breath, nocturnal dyspnea, orthopnea or peripheral edema.  There have been no palpitations, lightheadedness or syncope.    DATE TEST EF   6/19 Echo   50-55 %   10/21 Echo   60-65%     Date Cr K  6/19 0.65 4.5            Records and Results Reviewed   Past Medical History:  Diagnosis Date   Adenomatous polyp 02/2017   Dr. Paulita Fujita   Arthritis    Depression    Hypercholesterolemia    goal LDL less the 160    Past Surgical History:  Procedure Laterality Date   PACEMAKER IMPLANT N/A 08/13/2017   Procedure: PACEMAKER IMPLANT;  Surgeon: Deboraha Sprang, MD;  Location: [facility];  Service: Cardiovascular;  Laterality: N/A;   REDUCTION MAMMAPLASTY Bilateral 2004    No outpatient medications have been marked as taking for the 02/22/21 encounter (Appointment) with Deboraha Sprang, MD.    Allergies  Allergen Reactions   Calcium + Vitamin D3 [Calcium Carb-Cholecalciferol] Other (See Comments)    Upset stomach   Citalopram Hydrobromide Other (See Comments)    QT prolonged   Simvastatin Other (See Comments)    Restless legs      Review of Systems negative except from HPI and PMH  Physical Exam There were no vitals taken for this visit.  Well developed and well nourished in no acute distress HENT normal Neck supple with JVP-flat Clear Device pocket well healed; without hematoma or erythema.  There is no tethering  Regular rate and rhythm, no *** gallop No ***/*** murmur Abd-soft with active BS No Clubbing cyanosis *** edema Skin-warm and dry A & Oriented  Grossly normal sensory and  motor function  ECG ***   Assessment and  Plan  Sinus node dysfunction   Ventricular tachycardia nonsustained-monomorphic   Tachycardia nonsustained-polymorphic possibly ventricular but probably supraventricular   Syncope-neurally mediated   Orthostatic intolerance  Pacemaker, non selective His Medtronic    Doing well   No OI    No syncope  No VT    Current medicines are reviewed at length with the patient today .  The patient does not*  have concerns regarding medicines.

## 2021-03-01 DIAGNOSIS — F325 Major depressive disorder, single episode, in full remission: Secondary | ICD-10-CM | POA: Diagnosis not present

## 2021-03-01 DIAGNOSIS — Z950 Presence of cardiac pacemaker: Secondary | ICD-10-CM | POA: Diagnosis not present

## 2021-03-01 DIAGNOSIS — E7800 Pure hypercholesterolemia, unspecified: Secondary | ICD-10-CM | POA: Diagnosis not present

## 2021-03-01 DIAGNOSIS — Z0000 Encounter for general adult medical examination without abnormal findings: Secondary | ICD-10-CM | POA: Diagnosis not present

## 2021-03-01 DIAGNOSIS — Z1389 Encounter for screening for other disorder: Secondary | ICD-10-CM | POA: Diagnosis not present

## 2021-03-01 DIAGNOSIS — N393 Stress incontinence (female) (male): Secondary | ICD-10-CM | POA: Diagnosis not present

## 2021-03-01 DIAGNOSIS — Z79899 Other long term (current) drug therapy: Secondary | ICD-10-CM | POA: Diagnosis not present

## 2021-03-01 DIAGNOSIS — J3081 Allergic rhinitis due to animal (cat) (dog) hair and dander: Secondary | ICD-10-CM | POA: Diagnosis not present

## 2021-04-01 DIAGNOSIS — H52203 Unspecified astigmatism, bilateral: Secondary | ICD-10-CM | POA: Diagnosis not present

## 2021-04-06 ENCOUNTER — Other Ambulatory Visit: Payer: Self-pay | Admitting: Geriatric Medicine

## 2021-04-06 DIAGNOSIS — Z1231 Encounter for screening mammogram for malignant neoplasm of breast: Secondary | ICD-10-CM

## 2021-05-12 ENCOUNTER — Ambulatory Visit (INDEPENDENT_AMBULATORY_CARE_PROVIDER_SITE_OTHER): Payer: Medicare Other

## 2021-05-12 DIAGNOSIS — I495 Sick sinus syndrome: Secondary | ICD-10-CM | POA: Diagnosis not present

## 2021-05-13 LAB — CUP PACEART REMOTE DEVICE CHECK
Battery Remaining Longevity: 118 mo
Battery Voltage: 3.01 V
Brady Statistic AP VP Percent: 0.6 %
Brady Statistic AP VS Percent: 98.39 %
Brady Statistic AS VP Percent: 0 %
Brady Statistic AS VS Percent: 1.01 %
Brady Statistic RA Percent Paced: 99.53 %
Brady Statistic RV Percent Paced: 0.6 %
Date Time Interrogation Session: 20230323112057
Implantable Lead Implant Date: 20190625
Implantable Lead Implant Date: 20190625
Implantable Lead Location: 753859
Implantable Lead Location: 753860
Implantable Lead Model: 3830
Implantable Lead Model: 5076
Implantable Pulse Generator Implant Date: 20190625
Lead Channel Impedance Value: 323 Ohm
Lead Channel Impedance Value: 361 Ohm
Lead Channel Impedance Value: 418 Ohm
Lead Channel Impedance Value: 456 Ohm
Lead Channel Pacing Threshold Amplitude: 0.375 V
Lead Channel Pacing Threshold Amplitude: 0.875 V
Lead Channel Pacing Threshold Pulse Width: 0.4 ms
Lead Channel Pacing Threshold Pulse Width: 0.4 ms
Lead Channel Sensing Intrinsic Amplitude: 1 mV
Lead Channel Sensing Intrinsic Amplitude: 1 mV
Lead Channel Sensing Intrinsic Amplitude: 4.875 mV
Lead Channel Sensing Intrinsic Amplitude: 4.875 mV
Lead Channel Setting Pacing Amplitude: 1.5 V
Lead Channel Setting Pacing Amplitude: 2.5 V
Lead Channel Setting Pacing Pulse Width: 0.4 ms
Lead Channel Setting Sensing Sensitivity: 1.2 mV

## 2021-05-16 ENCOUNTER — Ambulatory Visit: Payer: Medicare Other

## 2021-05-20 NOTE — Progress Notes (Signed)
Remote pacemaker transmission.   

## 2021-06-06 ENCOUNTER — Ambulatory Visit
Admission: RE | Admit: 2021-06-06 | Discharge: 2021-06-06 | Disposition: A | Discharge status: Home / Self Care | Payer: Medicare Other | Source: Ambulatory Visit | Attending: Geriatric Medicine | Admitting: Geriatric Medicine

## 2021-06-06 DIAGNOSIS — Z1231 Encounter for screening mammogram for malignant neoplasm of breast: Secondary | ICD-10-CM | POA: Diagnosis not present

## 2021-07-01 DIAGNOSIS — L821 Other seborrheic keratosis: Secondary | ICD-10-CM | POA: Diagnosis not present

## 2021-07-01 DIAGNOSIS — R229 Localized swelling, mass and lump, unspecified: Secondary | ICD-10-CM | POA: Diagnosis not present

## 2021-07-01 DIAGNOSIS — Z08 Encounter for follow-up examination after completed treatment for malignant neoplasm: Secondary | ICD-10-CM | POA: Diagnosis not present

## 2021-07-01 DIAGNOSIS — Z85828 Personal history of other malignant neoplasm of skin: Secondary | ICD-10-CM | POA: Diagnosis not present

## 2021-07-01 DIAGNOSIS — L989 Disorder of the skin and subcutaneous tissue, unspecified: Secondary | ICD-10-CM | POA: Diagnosis not present

## 2021-08-11 ENCOUNTER — Ambulatory Visit (INDEPENDENT_AMBULATORY_CARE_PROVIDER_SITE_OTHER): Payer: Medicare Other

## 2021-08-11 DIAGNOSIS — I495 Sick sinus syndrome: Secondary | ICD-10-CM | POA: Diagnosis not present

## 2021-08-14 LAB — CUP PACEART REMOTE DEVICE CHECK
Battery Remaining Longevity: 116 mo
Battery Voltage: 3 V
Brady Statistic AP VS Percent: 98.52 %
Brady Statistic AS VP Percent: 0 %
Brady Statistic AS VS Percent: 1.03 %
Brady Statistic RA Percent Paced: 99.44 %
Brady Statistic RV Percent Paced: 0.45 %
Date Time Interrogation Session: 20230621201606
Implantable Lead Implant Date: 20190625
Implantable Lead Implant Date: 20190625
Implantable Lead Location: 753859
Implantable Lead Location: 753860
Implantable Lead Model: 5076
Implantable Pulse Generator Implant Date: 20190625
Lead Channel Impedance Value: 380 Ohm
Lead Channel Impedance Value: 475 Ohm
Lead Channel Pacing Threshold Amplitude: 0.375 V
Lead Channel Pacing Threshold Amplitude: 0.875 V
Lead Channel Pacing Threshold Pulse Width: 0.4 ms
Lead Channel Pacing Threshold Pulse Width: 0.4 ms
Lead Channel Sensing Intrinsic Amplitude: 1 mV
Lead Channel Sensing Intrinsic Amplitude: 1 mV
Lead Channel Sensing Intrinsic Amplitude: 5.625 mV
Lead Channel Sensing Intrinsic Amplitude: 5.625 mV
Lead Channel Setting Pacing Amplitude: 1.5 V
Lead Channel Setting Pacing Amplitude: 2.5 V
Lead Channel Setting Pacing Pulse Width: 0.4 ms
Lead Channel Setting Sensing Sensitivity: 1.2 mV

## 2021-08-18 NOTE — Progress Notes (Signed)
Remote pacemaker transmission.   

## 2021-10-06 IMAGING — MG DIGITAL SCREENING BILAT W/ TOMO W/ CAD
6 of 10 series · 6 of 30 positions shown · non-contrast
Comparison: Previous exam(s).

CLINICAL DATA: Screening.

EXAM:
DIGITAL SCREENING BILATERAL MAMMOGRAM WITH TOMO AND CAD

[L CC synth-2D]
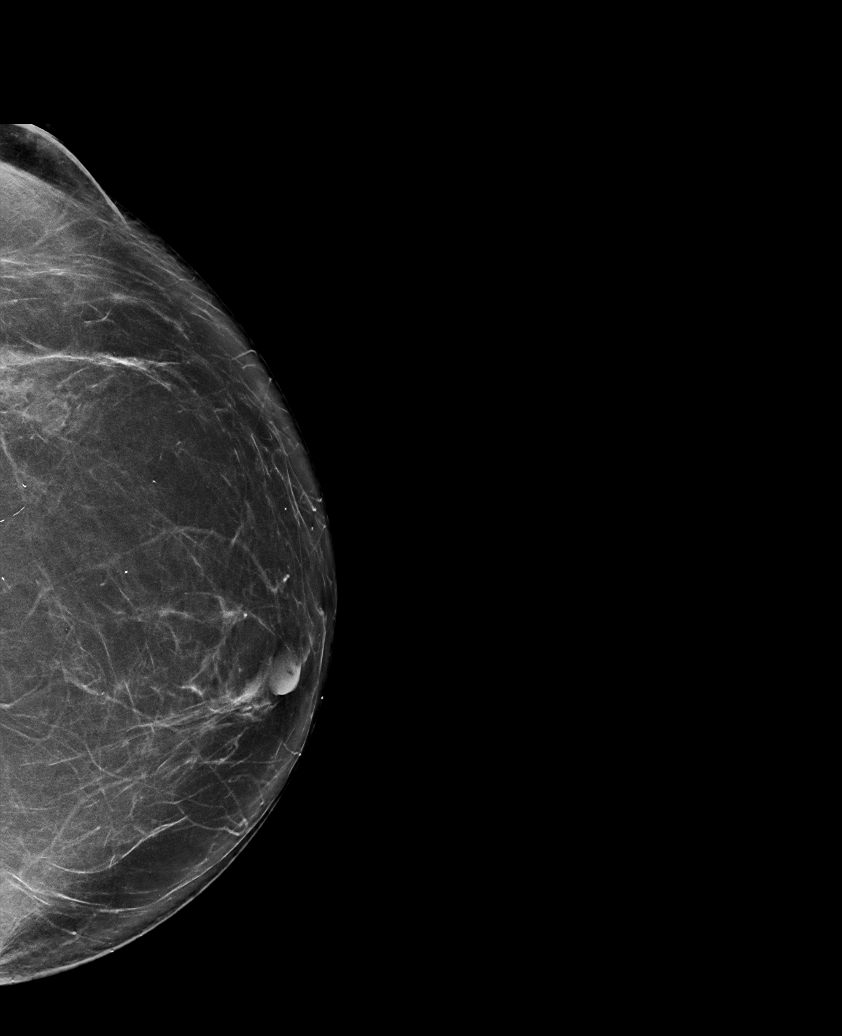

[R CC synth-2D]
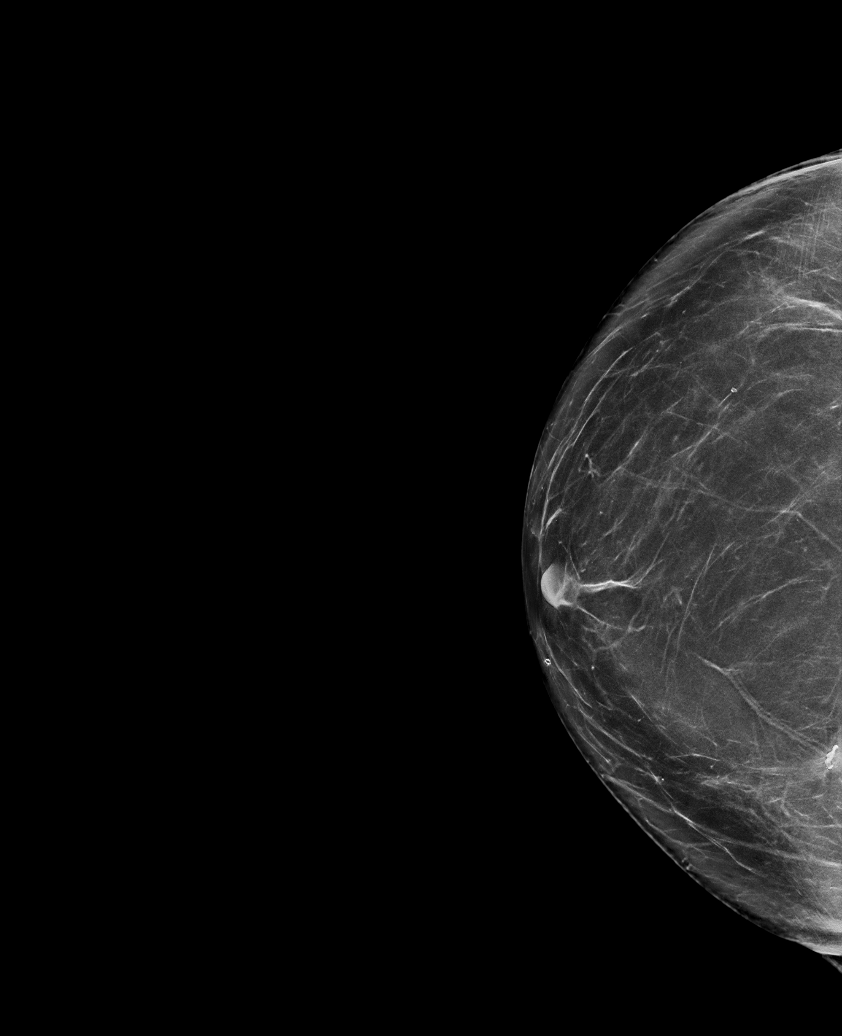

[L MLO synth-2D (1 of 2)]
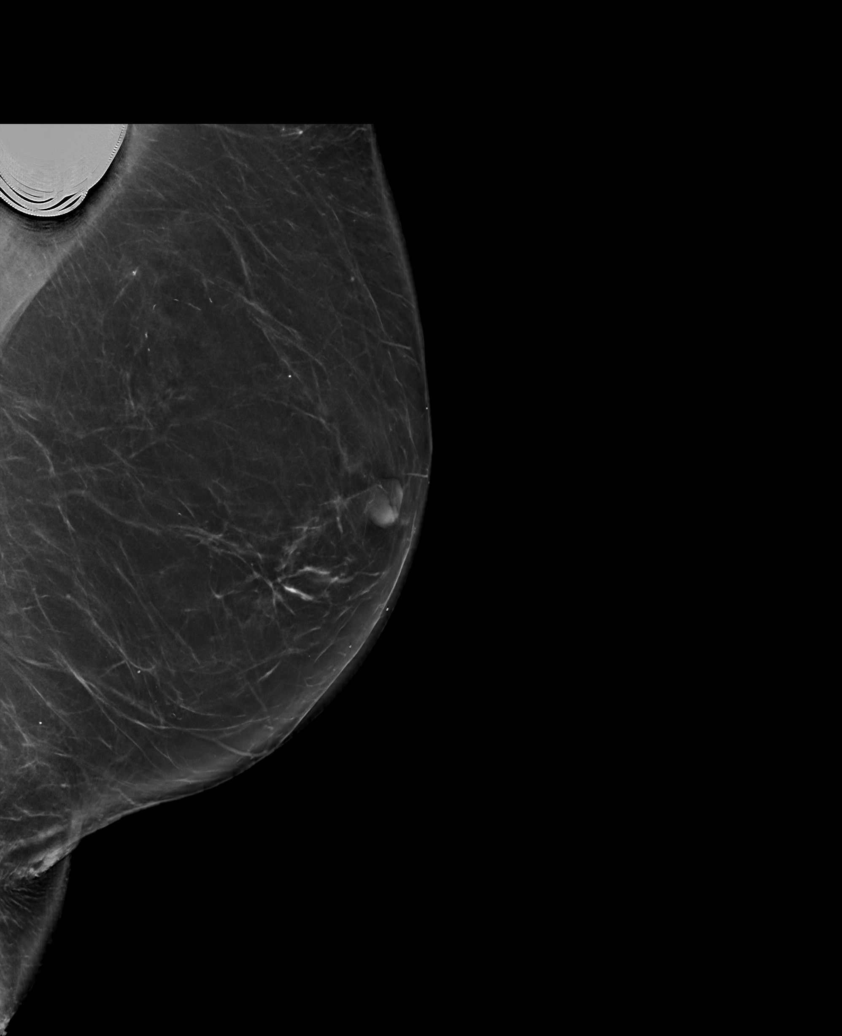

[R MLO synth-2D]
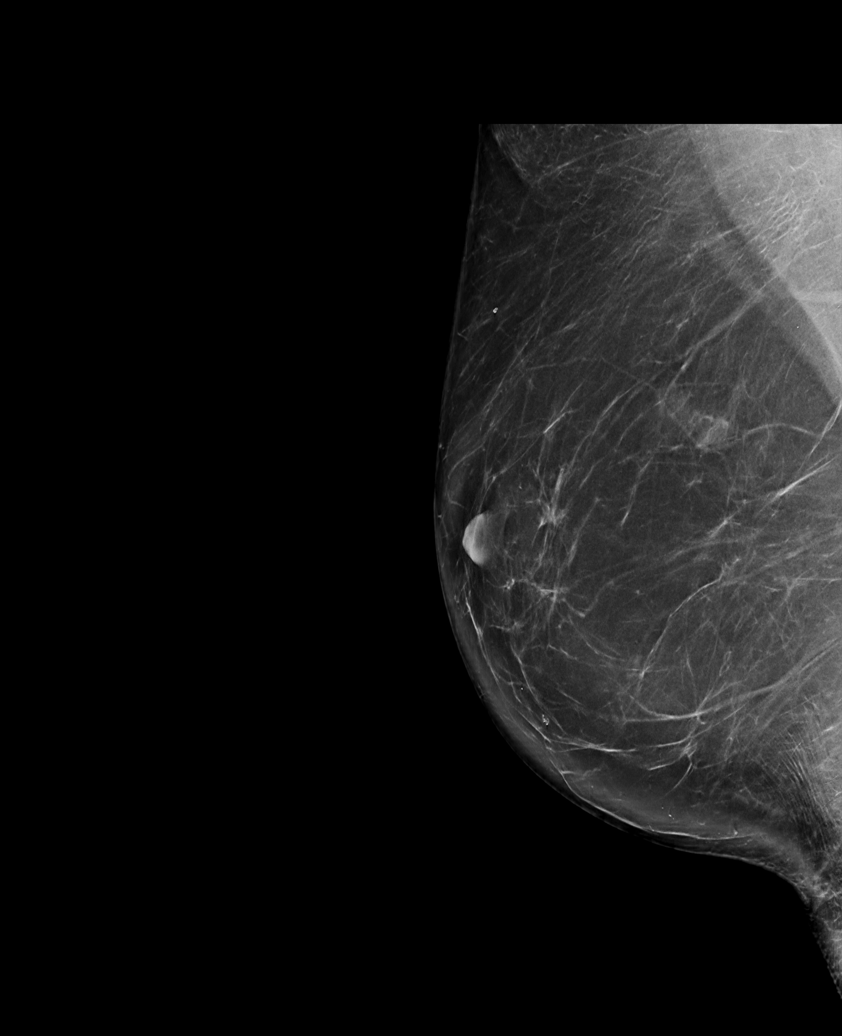

[L MLO synth-2D (2 of 2)]
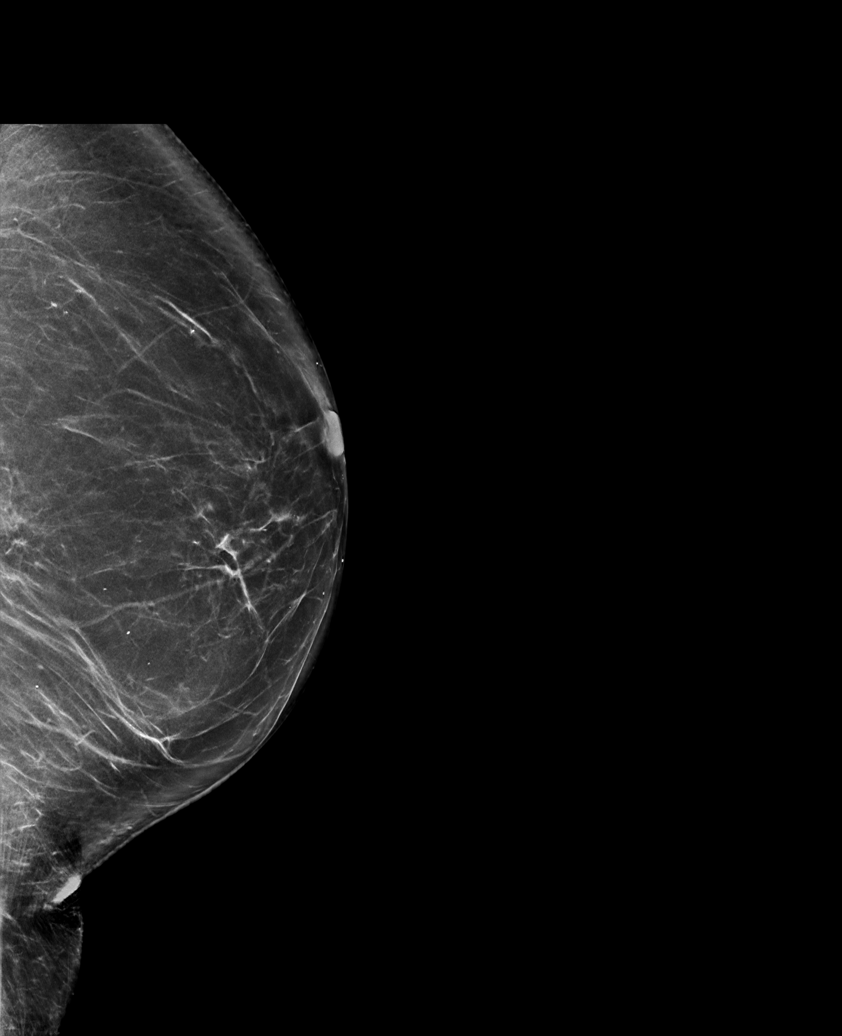

[L MLO tomo · tomo slice 43/85.0]
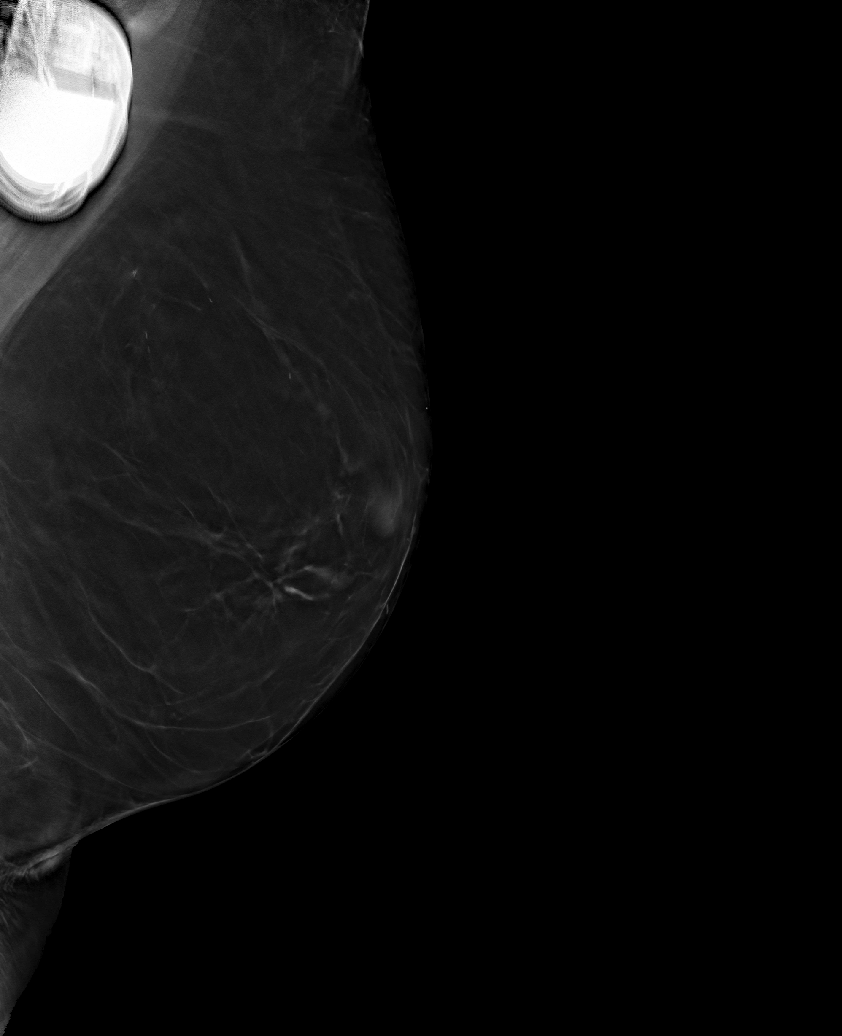

[6 of 30 positions shown; findings below may reference images not displayed]

ACR Breast Density Category b: There are scattered areas of
fibroglandular density.
FINDINGS: There are no findings suspicious for malignancy. Images were
processed with CAD.
IMPRESSION: No mammographic evidence of malignancy. A result letter of this
screening mammogram will be mailed directly to the patient.

RECOMMENDATION:
Screening mammogram in one year. (Code:[id number]-775)

BI-RADS CATEGORY  1: Negative.

## 2021-11-08 DIAGNOSIS — L578 Other skin changes due to chronic exposure to nonionizing radiation: Secondary | ICD-10-CM | POA: Diagnosis not present

## 2021-11-08 DIAGNOSIS — L570 Actinic keratosis: Secondary | ICD-10-CM | POA: Diagnosis not present

## 2021-11-08 DIAGNOSIS — L821 Other seborrheic keratosis: Secondary | ICD-10-CM | POA: Diagnosis not present

## 2021-11-10 ENCOUNTER — Ambulatory Visit (INDEPENDENT_AMBULATORY_CARE_PROVIDER_SITE_OTHER): Payer: Medicare Other

## 2021-11-10 DIAGNOSIS — I495 Sick sinus syndrome: Secondary | ICD-10-CM | POA: Diagnosis not present

## 2021-11-10 LAB — CUP PACEART REMOTE DEVICE CHECK
Battery Remaining Longevity: 113 mo
Battery Voltage: 3 V
Brady Statistic AP VP Percent: 0.26 %
Brady Statistic AP VS Percent: 99.09 %
Brady Statistic AS VP Percent: 0 %
Brady Statistic AS VS Percent: 0.65 %
Brady Statistic RA Percent Paced: 99.62 %
Brady Statistic RV Percent Paced: 0.26 %
Date Time Interrogation Session: 20230921055718
Implantable Lead Implant Date: 20190625
Implantable Lead Implant Date: 20190625
Implantable Lead Location: 753859
Implantable Lead Location: 753860
Implantable Lead Model: 3830
Implantable Lead Model: 5076
Implantable Pulse Generator Implant Date: 20190625
Lead Channel Impedance Value: 323 Ohm
Lead Channel Impedance Value: 380 Ohm
Lead Channel Impedance Value: 418 Ohm
Lead Channel Impedance Value: 475 Ohm
Lead Channel Pacing Threshold Amplitude: 0.375 V
Lead Channel Pacing Threshold Amplitude: 0.875 V
Lead Channel Pacing Threshold Pulse Width: 0.4 ms
Lead Channel Pacing Threshold Pulse Width: 0.4 ms
Lead Channel Sensing Intrinsic Amplitude: 1 mV
Lead Channel Sensing Intrinsic Amplitude: 1 mV
Lead Channel Sensing Intrinsic Amplitude: 4.75 mV
Lead Channel Sensing Intrinsic Amplitude: 4.75 mV
Lead Channel Setting Pacing Amplitude: 1.5 V
Lead Channel Setting Pacing Amplitude: 2.5 V
Lead Channel Setting Pacing Pulse Width: 0.4 ms
Lead Channel Setting Sensing Sensitivity: 1.2 mV

## 2021-11-12 DIAGNOSIS — G252 Other specified forms of tremor: Secondary | ICD-10-CM | POA: Diagnosis not present

## 2021-11-12 DIAGNOSIS — Z79899 Other long term (current) drug therapy: Secondary | ICD-10-CM | POA: Diagnosis not present

## 2021-11-12 DIAGNOSIS — Z136 Encounter for screening for cardiovascular disorders: Secondary | ICD-10-CM | POA: Diagnosis not present

## 2021-11-12 DIAGNOSIS — R4182 Altered mental status, unspecified: Secondary | ICD-10-CM | POA: Diagnosis not present

## 2021-11-12 DIAGNOSIS — R29818 Other symptoms and signs involving the nervous system: Secondary | ICD-10-CM | POA: Diagnosis not present

## 2021-11-12 DIAGNOSIS — R4701 Aphasia: Secondary | ICD-10-CM | POA: Diagnosis not present

## 2021-11-12 DIAGNOSIS — G9340 Encephalopathy, unspecified: Secondary | ICD-10-CM | POA: Diagnosis not present

## 2021-11-12 DIAGNOSIS — G9349 Other encephalopathy: Secondary | ICD-10-CM | POA: Diagnosis not present

## 2021-11-12 DIAGNOSIS — Z950 Presence of cardiac pacemaker: Secondary | ICD-10-CM | POA: Diagnosis not present

## 2021-11-12 DIAGNOSIS — R251 Tremor, unspecified: Secondary | ICD-10-CM | POA: Diagnosis not present

## 2021-11-12 DIAGNOSIS — E7800 Pure hypercholesterolemia, unspecified: Secondary | ICD-10-CM | POA: Diagnosis not present

## 2021-11-12 DIAGNOSIS — Z7902 Long term (current) use of antithrombotics/antiplatelets: Secondary | ICD-10-CM | POA: Diagnosis not present

## 2021-11-12 DIAGNOSIS — R9431 Abnormal electrocardiogram [ECG] [EKG]: Secondary | ICD-10-CM | POA: Diagnosis not present

## 2021-11-12 DIAGNOSIS — I639 Cerebral infarction, unspecified: Secondary | ICD-10-CM | POA: Diagnosis not present

## 2021-11-12 DIAGNOSIS — M6281 Muscle weakness (generalized): Secondary | ICD-10-CM | POA: Diagnosis not present

## 2021-11-13 DIAGNOSIS — I083 Combined rheumatic disorders of mitral, aortic and tricuspid valves: Secondary | ICD-10-CM | POA: Diagnosis not present

## 2021-11-13 DIAGNOSIS — R4701 Aphasia: Secondary | ICD-10-CM | POA: Diagnosis not present

## 2021-11-13 DIAGNOSIS — R2689 Other abnormalities of gait and mobility: Secondary | ICD-10-CM | POA: Diagnosis not present

## 2021-11-13 DIAGNOSIS — R251 Tremor, unspecified: Secondary | ICD-10-CM | POA: Diagnosis not present

## 2021-11-14 DIAGNOSIS — R29818 Other symptoms and signs involving the nervous system: Secondary | ICD-10-CM | POA: Diagnosis not present

## 2021-11-16 DIAGNOSIS — R4701 Aphasia: Secondary | ICD-10-CM | POA: Diagnosis not present

## 2021-11-16 DIAGNOSIS — R262 Difficulty in walking, not elsewhere classified: Secondary | ICD-10-CM | POA: Diagnosis not present

## 2021-11-16 DIAGNOSIS — M6281 Muscle weakness (generalized): Secondary | ICD-10-CM | POA: Diagnosis not present

## 2021-11-17 DIAGNOSIS — R5383 Other fatigue: Secondary | ICD-10-CM | POA: Diagnosis not present

## 2021-11-21 NOTE — Progress Notes (Signed)
Remote pacemaker transmission.   

## 2022-02-09 ENCOUNTER — Ambulatory Visit (INDEPENDENT_AMBULATORY_CARE_PROVIDER_SITE_OTHER): Payer: Medicare Other

## 2022-02-09 DIAGNOSIS — I495 Sick sinus syndrome: Secondary | ICD-10-CM

## 2022-02-09 LAB — CUP PACEART REMOTE DEVICE CHECK
Battery Remaining Longevity: 109 mo
Battery Voltage: 3 V
Brady Statistic AP VP Percent: 0.43 %
Brady Statistic AP VS Percent: 98.83 %
Brady Statistic AS VP Percent: 0 %
Brady Statistic AS VS Percent: 0.74 %
Brady Statistic RA Percent Paced: 99.65 %
Brady Statistic RV Percent Paced: 0.43 %
Date Time Interrogation Session: 20231220232406
Implantable Lead Connection Status: 753985
Implantable Lead Connection Status: 753985
Implantable Lead Implant Date: 20190625
Implantable Lead Implant Date: 20190625
Implantable Lead Location: 753859
Implantable Lead Location: 753860
Implantable Lead Model: 3830
Implantable Lead Model: 5076
Implantable Pulse Generator Implant Date: 20190625
Lead Channel Impedance Value: 304 Ohm
Lead Channel Impedance Value: 342 Ohm
Lead Channel Impedance Value: 418 Ohm
Lead Channel Impedance Value: 437 Ohm
Lead Channel Pacing Threshold Amplitude: 0.375 V
Lead Channel Pacing Threshold Amplitude: 0.875 V
Lead Channel Pacing Threshold Pulse Width: 0.4 ms
Lead Channel Pacing Threshold Pulse Width: 0.4 ms
Lead Channel Sensing Intrinsic Amplitude: 0.875 mV
Lead Channel Sensing Intrinsic Amplitude: 0.875 mV
Lead Channel Sensing Intrinsic Amplitude: 4.625 mV
Lead Channel Sensing Intrinsic Amplitude: 4.625 mV
Lead Channel Setting Pacing Amplitude: 1.5 V
Lead Channel Setting Pacing Amplitude: 2.5 V
Lead Channel Setting Pacing Pulse Width: 0.4 ms
Lead Channel Setting Sensing Sensitivity: 1.2 mV
Zone Setting Status: 755011
Zone Setting Status: 755011

## 2022-02-28 DIAGNOSIS — D1801 Hemangioma of skin and subcutaneous tissue: Secondary | ICD-10-CM | POA: Diagnosis not present

## 2022-02-28 DIAGNOSIS — Z08 Encounter for follow-up examination after completed treatment for malignant neoplasm: Secondary | ICD-10-CM | POA: Diagnosis not present

## 2022-02-28 DIAGNOSIS — Z85828 Personal history of other malignant neoplasm of skin: Secondary | ICD-10-CM | POA: Diagnosis not present

## 2022-02-28 DIAGNOSIS — L821 Other seborrheic keratosis: Secondary | ICD-10-CM | POA: Diagnosis not present

## 2022-03-03 NOTE — Progress Notes (Signed)
Remote pacemaker transmission.   

## 2022-03-13 ENCOUNTER — Telehealth: Payer: Self-pay

## 2022-03-13 ENCOUNTER — Encounter: Payer: Self-pay | Admitting: Internal Medicine

## 2022-03-13 ENCOUNTER — Ambulatory Visit: Payer: Medicare Other | Attending: Internal Medicine | Admitting: Internal Medicine

## 2022-03-13 VITALS — Ht 67.0 in

## 2022-03-13 DIAGNOSIS — Z950 Presence of cardiac pacemaker: Secondary | ICD-10-CM | POA: Diagnosis not present

## 2022-03-13 DIAGNOSIS — I495 Sick sinus syndrome: Secondary | ICD-10-CM

## 2022-03-13 DIAGNOSIS — I951 Orthostatic hypotension: Secondary | ICD-10-CM

## 2022-03-13 DIAGNOSIS — R000 Tachycardia, unspecified: Secondary | ICD-10-CM

## 2022-03-13 DIAGNOSIS — I472 Ventricular tachycardia, unspecified: Secondary | ICD-10-CM | POA: Diagnosis not present

## 2022-03-13 DIAGNOSIS — R55 Syncope and collapse: Secondary | ICD-10-CM

## 2022-03-13 NOTE — Progress Notes (Signed)
Electrophysiology TeleHealth Note   Due to national recommendations of social distancing due to COVID 19, an audio/video telehealth visit is felt to be most appropriate for this patient at this time.  See MyChart message from today for the patient's consent to telehealth for [facility].   Date:  03/13/2022   ID:  [name], DOB 04/25/1945, MRN [id number]  Location: patient's home  Provider location: [address], [place]  Evaluation Performed: Follow-up visit  PCP:  Oletta Cohn   Cardiologist:     Electrophysiologist:  SK   Chief Complaint:  syncope  History of Present Illness:    Joanna Haas is a 77 y.o. female who presents via audio/video conferencing for a telehealth visit today.  Since last being seen in our clinic pacemaker implanted 6/19 for symptomatic sinus node dysfunction, bradycardia associated but not necessarily dependent ventricular tachycardia with monomorphic and polymorphic beats detected, the patient reports The patient denies chest pain, shortness of breath, nocturnal dyspnea, orthopnea or peripheral edema.  There have been no palpitations, lightheadedness or syncope.    .  interval episode of altered mental status with confusion    10/21 Echo 60-65%    6/19 Echo   50-55 %    10/21 Echo  60-65%    9/23 Echo (CE)  60% PFO       Date Cr K Hgb  6/19 0.65 4.5 14.2   12/21 0.73 4.4 15  9/23 0.8 4.7 14.7           Past Medical History:  Diagnosis Date   Adenomatous polyp 02/2017   Dr. Paulita Fujita   Arthritis    Depression    Hypercholesterolemia    goal LDL less the 160    Past Surgical History:  Procedure Laterality Date   PACEMAKER IMPLANT N/A 08/13/2017   Procedure: PACEMAKER IMPLANT;  Surgeon: Deboraha Sprang, MD;  Location: [facility];  Service: Cardiovascular;  Laterality: N/A;   REDUCTION MAMMAPLASTY Bilateral 2004    Current Outpatient Medications  Medication Sig Dispense Refill   atorvastatin  (LIPITOR) 20 MG tablet Take 20 mg by mouth daily.     fluticasone (FLONASE) 50 MCG/ACT nasal spray Place 2 sprays into both nostrils daily as needed for allergies.     ibuprofen (ADVIL,MOTRIN) 200 MG tablet Take 200 mg by mouth every 6 (six) hours as needed for headache, mild pain, moderate pain or cramping.     No current facility-administered medications for this visit.    Allergies:   Calcium + vitamin d3 [calcium carb-cholecalciferol], Citalopram hydrobromide, and Simvastatin      ROS:  Please see the history of present illness.   All other systems are personally reviewed and negative.    Exam:    Vital Signs:  Ht '5\' 7"'$  (1.702 m)   BMI 26.78 kg/m      Labs/Other Tests and Data Reviewed:    Recent Labs: No results found for requested labs within last 365 days.   Wt Readings from Last 3 Encounters:  02/22/21 171 lb (77.6 kg)  11/21/19 164 lb 12.8 oz (74.8 kg)  09/26/18 175 lb (79.4 kg)     Other studies personally reviewed: Additional studies/ records that were reviewed today include: (As above)   Review of the above records today demonstrates: (As above)   Last device remote is reviewed from [facility] PDF dated 12/23 which reveals normal device function,   arrhythmias - some VT nonsustained  ASSESSMENT & PLAN:    Sinus node dysfunction   Ventricular tachycardia nonsustained-monomorphic   Tachycardia nonsustained-polymorphic possibly ventricular but probably supraventricular   Syncope-neurally mediated   Orthostatic intolerance   Pacemaker, non selective His Medtronic   Neurological event --altered mental status   No syncope  Lost dog of 10 yrr a few weeks ago BINGO   Neuro eval was unrevealing but "dehyrdration/exhaustion" seems unlikely to me.       Follow-up:  51 m Next remote: NA  Current medicines are reviewed at length with the patient today.   The patient does not have concerns regarding her medicines.  The following changes were made today:   none  Labs/ tests ordered today include: na No orders of the defined types were placed in this encounter.   Future tests       Today, I have spent 21 minutes with the patient with telehealth technology discussing the above.  Signed, Virl Axe, MD  03/13/2022 2:12 PM     [place] [address] [phone] (office) [phone] (fax)

## 2022-03-13 NOTE — Patient Instructions (Signed)
Medication Instructions:  Your physician recommends that you continue on your current medications as directed. Please refer to the Current Medication list given to you today.  *If you need a refill on your cardiac medications before your next appointment, please call your pharmacy*   Lab Work: None ordered.  If you have labs (blood work) drawn today and your tests are completely normal, you will receive your results only by: MyChart Message (if you have MyChart) OR A paper copy in the mail If you have any lab test that is abnormal or we need to change your treatment, we will call you to review the results.   Testing/Procedures: None ordered.    Follow-Up: At [facility], you and your health needs are our priority.  As part of our continuing mission to provide you with exceptional heart care, we have created designated Provider Care Teams.  These Care Teams include your primary Cardiologist (physician) and Advanced Practice Providers (APPs -  Physician Assistants and Nurse Practitioners) who all work together to provide you with the care you need, when you need it.  We recommend signing up for the patient portal called "MyChart".  Sign up information is provided on this After Visit Summary.  MyChart is used to connect with patients for Virtual Visits (Telemedicine).  Patients are able to view lab/test results, encounter notes, upcoming appointments, etc.  Non-urgent messages can be sent to your provider as well.   To learn more about what you can do with MyChart, go to [email or web address].    Your next appointment:   12 months with Dr Klein 

## 2022-03-13 NOTE — Telephone Encounter (Signed)
  Patient Consent for Virtual Visit        Joanna Haas has provided verbal consent on 03/13/2022 for a virtual visit (video or telephone).   CONSENT FOR VIRTUAL VISIT FOR:  Joanna Haas  By participating in this virtual visit I agree to the following:  I hereby voluntarily request, consent and authorize [facility] and its employed or contracted physicians, physician assistants, nurse practitioners or other licensed health care professionals (the Practitioner), to provide me with telemedicine health care services (the "Services") as deemed necessary by the treating Practitioner. I acknowledge and consent to receive the Services by the Practitioner via telemedicine. I understand that the telemedicine visit will involve communicating with the Practitioner through live audiovisual communication technology and the disclosure of certain medical information by electronic transmission. I acknowledge that I have been given the opportunity to request an in-person assessment or other available alternative prior to the telemedicine visit and am voluntarily participating in the telemedicine visit.  I understand that I have the right to withhold or withdraw my consent to the use of telemedicine in the course of my care at any time, without affecting my right to future care or treatment, and that the Practitioner or I may terminate the telemedicine visit at any time. I understand that I have the right to inspect all information obtained and/or recorded in the course of the telemedicine visit and may receive copies of available information for a reasonable fee.  I understand that some of the potential risks of receiving the Services via telemedicine include:  Delay or interruption in medical evaluation due to technological equipment failure or disruption; Information transmitted may not be sufficient (e.g. poor resolution of images) to allow for appropriate medical decision making by the Practitioner;  and/or  In rare instances, security protocols could fail, causing a breach of personal health information.  Furthermore, I acknowledge that it is my responsibility to provide information about my medical history, conditions and care that is complete and accurate to the best of my ability. I acknowledge that Practitioner's advice, recommendations, and/or decision may be based on factors not within their control, such as incomplete or inaccurate data provided by me or distortions of diagnostic images or specimens that may result from electronic transmissions. I understand that the practice of medicine is not an exact science and that Practitioner makes no warranties or guarantees regarding treatment outcomes. I acknowledge that a copy of this consent can be made available to me via my patient portal ([facility]), or I can request a printed copy by calling the office of [facility].    I understand that my insurance will be billed for this visit.   I have read or had this consent read to me. I understand the contents of this consent, which adequately explains the benefits and risks of the Services being provided via telemedicine.  I have been provided ample opportunity to ask questions regarding this consent and the Services and have had my questions answered to my satisfaction. I give my informed consent for the services to be provided through the use of telemedicine in my medical care

## 2022-04-05 DIAGNOSIS — Z79899 Other long term (current) drug therapy: Secondary | ICD-10-CM | POA: Diagnosis not present

## 2022-04-05 DIAGNOSIS — Z950 Presence of cardiac pacemaker: Secondary | ICD-10-CM | POA: Diagnosis not present

## 2022-04-05 DIAGNOSIS — N393 Stress incontinence (female) (male): Secondary | ICD-10-CM | POA: Diagnosis not present

## 2022-04-05 DIAGNOSIS — Z23 Encounter for immunization: Secondary | ICD-10-CM | POA: Diagnosis not present

## 2022-04-05 DIAGNOSIS — E7800 Pure hypercholesterolemia, unspecified: Secondary | ICD-10-CM | POA: Diagnosis not present

## 2022-04-05 DIAGNOSIS — Z0000 Encounter for general adult medical examination without abnormal findings: Secondary | ICD-10-CM | POA: Diagnosis not present

## 2022-04-05 DIAGNOSIS — Z5181 Encounter for therapeutic drug level monitoring: Secondary | ICD-10-CM | POA: Diagnosis not present

## 2022-04-21 DIAGNOSIS — H52203 Unspecified astigmatism, bilateral: Secondary | ICD-10-CM | POA: Diagnosis not present

## 2022-05-03 ENCOUNTER — Other Ambulatory Visit: Payer: Self-pay | Admitting: Internal Medicine

## 2022-05-03 DIAGNOSIS — Z1231 Encounter for screening mammogram for malignant neoplasm of breast: Secondary | ICD-10-CM

## 2022-05-11 ENCOUNTER — Ambulatory Visit (INDEPENDENT_AMBULATORY_CARE_PROVIDER_SITE_OTHER): Payer: Medicare Other

## 2022-05-11 DIAGNOSIS — K080 Exfoliation of teeth due to systemic causes: Secondary | ICD-10-CM | POA: Diagnosis not present

## 2022-05-11 DIAGNOSIS — I495 Sick sinus syndrome: Secondary | ICD-10-CM | POA: Diagnosis not present

## 2022-05-11 LAB — CUP PACEART REMOTE DEVICE CHECK
Battery Remaining Longevity: 107 mo
Battery Voltage: 3 V
Brady Statistic AP VP Percent: 0.74 %
Brady Statistic AP VS Percent: 97.75 %
Brady Statistic AS VP Percent: 0 %
Brady Statistic AS VS Percent: 1.51 %
Brady Statistic RA Percent Paced: 99.37 %
Brady Statistic RV Percent Paced: 0.74 %
Date Time Interrogation Session: 20240320211336
Implantable Lead Connection Status: 753985
Implantable Lead Connection Status: 753985
Implantable Lead Implant Date: 20190625
Implantable Lead Implant Date: 20190625
Implantable Lead Location: 753859
Implantable Lead Location: 753860
Implantable Lead Model: 3830
Implantable Lead Model: 5076
Implantable Pulse Generator Implant Date: 20190625
Lead Channel Impedance Value: 323 Ohm
Lead Channel Impedance Value: 361 Ohm
Lead Channel Impedance Value: 418 Ohm
Lead Channel Impedance Value: 437 Ohm
Lead Channel Pacing Threshold Amplitude: 0.375 V
Lead Channel Pacing Threshold Amplitude: 0.875 V
Lead Channel Pacing Threshold Pulse Width: 0.4 ms
Lead Channel Pacing Threshold Pulse Width: 0.4 ms
Lead Channel Sensing Intrinsic Amplitude: 0.875 mV
Lead Channel Sensing Intrinsic Amplitude: 0.875 mV
Lead Channel Sensing Intrinsic Amplitude: 5 mV
Lead Channel Sensing Intrinsic Amplitude: 5 mV
Lead Channel Setting Pacing Amplitude: 1.5 V
Lead Channel Setting Pacing Amplitude: 2.5 V
Lead Channel Setting Pacing Pulse Width: 0.4 ms
Lead Channel Setting Sensing Sensitivity: 1.2 mV
Zone Setting Status: 755011
Zone Setting Status: 755011

## 2022-06-01 DIAGNOSIS — J208 Acute bronchitis due to other specified organisms: Secondary | ICD-10-CM | POA: Diagnosis not present

## 2022-06-01 DIAGNOSIS — B9689 Other specified bacterial agents as the cause of diseases classified elsewhere: Secondary | ICD-10-CM | POA: Diagnosis not present

## 2022-06-14 NOTE — Progress Notes (Signed)
Remote pacemaker transmission.   

## 2022-06-16 ENCOUNTER — Ambulatory Visit
Admission: RE | Admit: 2022-06-16 | Discharge: 2022-06-16 | Disposition: A | Discharge status: Home / Self Care | Payer: Medicare Other | Source: Ambulatory Visit | Attending: Internal Medicine | Admitting: Internal Medicine

## 2022-06-16 DIAGNOSIS — Z1231 Encounter for screening mammogram for malignant neoplasm of breast: Secondary | ICD-10-CM | POA: Diagnosis not present

## 2022-08-10 ENCOUNTER — Ambulatory Visit (INDEPENDENT_AMBULATORY_CARE_PROVIDER_SITE_OTHER): Payer: Medicare Other

## 2022-08-10 DIAGNOSIS — I495 Sick sinus syndrome: Secondary | ICD-10-CM | POA: Diagnosis not present

## 2022-08-10 LAB — CUP PACEART REMOTE DEVICE CHECK
Battery Remaining Longevity: 107 mo
Battery Voltage: 3 V
Brady Statistic AP VP Percent: 0.49 %
Brady Statistic AP VS Percent: 98.61 %
Brady Statistic AS VP Percent: 0 %
Brady Statistic AS VS Percent: 0.91 %
Brady Statistic RA Percent Paced: 99.57 %
Brady Statistic RV Percent Paced: 0.49 %
Date Time Interrogation Session: 20240619213317
Implantable Lead Connection Status: 753985
Implantable Lead Connection Status: 753985
Implantable Lead Implant Date: 20190625
Implantable Lead Implant Date: 20190625
Implantable Lead Location: 753859
Implantable Lead Location: 753860
Implantable Lead Model: 3830
Implantable Lead Model: 5076
Implantable Pulse Generator Implant Date: 20190625
Lead Channel Impedance Value: 304 Ohm
Lead Channel Impedance Value: 380 Ohm
Lead Channel Impedance Value: 418 Ohm
Lead Channel Impedance Value: 494 Ohm
Lead Channel Pacing Threshold Amplitude: 0.375 V
Lead Channel Pacing Threshold Amplitude: 0.875 V
Lead Channel Pacing Threshold Pulse Width: 0.4 ms
Lead Channel Pacing Threshold Pulse Width: 0.4 ms
Lead Channel Sensing Intrinsic Amplitude: 1.25 mV
Lead Channel Sensing Intrinsic Amplitude: 1.25 mV
Lead Channel Sensing Intrinsic Amplitude: 4.875 mV
Lead Channel Sensing Intrinsic Amplitude: 4.875 mV
Lead Channel Setting Pacing Amplitude: 1.5 V
Lead Channel Setting Pacing Amplitude: 2.5 V
Lead Channel Setting Pacing Pulse Width: 0.4 ms
Lead Channel Setting Sensing Sensitivity: 1.2 mV
Zone Setting Status: 755011
Zone Setting Status: 755011

## 2022-08-30 NOTE — Progress Notes (Signed)
Remote pacemaker transmission.   

## 2022-10-03 DIAGNOSIS — Z86010 Personal history of colonic polyps: Secondary | ICD-10-CM | POA: Diagnosis not present

## 2022-10-03 DIAGNOSIS — K5730 Diverticulosis of large intestine without perforation or abscess without bleeding: Secondary | ICD-10-CM | POA: Diagnosis not present

## 2022-10-03 DIAGNOSIS — D120 Benign neoplasm of cecum: Secondary | ICD-10-CM | POA: Diagnosis not present

## 2022-10-03 DIAGNOSIS — Z09 Encounter for follow-up examination after completed treatment for conditions other than malignant neoplasm: Secondary | ICD-10-CM | POA: Diagnosis not present

## 2022-10-05 DIAGNOSIS — D120 Benign neoplasm of cecum: Secondary | ICD-10-CM | POA: Diagnosis not present

## 2022-11-06 DIAGNOSIS — Z08 Encounter for follow-up examination after completed treatment for malignant neoplasm: Secondary | ICD-10-CM | POA: Diagnosis not present

## 2022-11-06 DIAGNOSIS — Z85828 Personal history of other malignant neoplasm of skin: Secondary | ICD-10-CM | POA: Diagnosis not present

## 2022-11-06 DIAGNOSIS — D1801 Hemangioma of skin and subcutaneous tissue: Secondary | ICD-10-CM | POA: Diagnosis not present

## 2022-11-06 DIAGNOSIS — L821 Other seborrheic keratosis: Secondary | ICD-10-CM | POA: Diagnosis not present

## 2022-11-08 LAB — CUP PACEART REMOTE DEVICE CHECK
Battery Remaining Longevity: 103 mo
Battery Voltage: 2.99 V
Brady Statistic AP VP Percent: 0.27 %
Brady Statistic AP VS Percent: 99.13 %
Brady Statistic AS VP Percent: 0 %
Brady Statistic AS VS Percent: 0.6 %
Brady Statistic RA Percent Paced: 99.64 %
Brady Statistic RV Percent Paced: 0.27 %
Date Time Interrogation Session: 20240918220157
Implantable Lead Connection Status: 753985
Implantable Lead Connection Status: 753985
Implantable Lead Implant Date: 20190625
Implantable Lead Implant Date: 20190625
Implantable Lead Location: 753859
Implantable Lead Location: 753860
Implantable Lead Model: 3830
Implantable Lead Model: 5076
Implantable Pulse Generator Implant Date: 20190625
Lead Channel Impedance Value: 304 Ohm
Lead Channel Impedance Value: 361 Ohm
Lead Channel Impedance Value: 399 Ohm
Lead Channel Impedance Value: 456 Ohm
Lead Channel Pacing Threshold Amplitude: 0.375 V
Lead Channel Pacing Threshold Amplitude: 1 V
Lead Channel Pacing Threshold Pulse Width: 0.4 ms
Lead Channel Pacing Threshold Pulse Width: 0.4 ms
Lead Channel Sensing Intrinsic Amplitude: 1 mV
Lead Channel Sensing Intrinsic Amplitude: 1 mV
Lead Channel Sensing Intrinsic Amplitude: 4.25 mV
Lead Channel Sensing Intrinsic Amplitude: 4.25 mV
Lead Channel Setting Pacing Amplitude: 1.5 V
Lead Channel Setting Pacing Amplitude: 2.5 V
Lead Channel Setting Pacing Pulse Width: 0.4 ms
Lead Channel Setting Sensing Sensitivity: 1.2 mV
Zone Setting Status: 755011
Zone Setting Status: 755011

## 2022-11-09 ENCOUNTER — Ambulatory Visit (INDEPENDENT_AMBULATORY_CARE_PROVIDER_SITE_OTHER): Payer: Medicare Other

## 2022-11-09 DIAGNOSIS — I495 Sick sinus syndrome: Secondary | ICD-10-CM

## 2022-11-21 NOTE — Progress Notes (Signed)
Remote pacemaker transmission.   

## 2022-12-07 DIAGNOSIS — K080 Exfoliation of teeth due to systemic causes: Secondary | ICD-10-CM | POA: Diagnosis not present

## 2023-02-08 ENCOUNTER — Ambulatory Visit (INDEPENDENT_AMBULATORY_CARE_PROVIDER_SITE_OTHER): Payer: Medicare Other

## 2023-02-08 DIAGNOSIS — I495 Sick sinus syndrome: Secondary | ICD-10-CM

## 2023-02-08 LAB — CUP PACEART REMOTE DEVICE CHECK
Battery Remaining Longevity: 101 mo
Battery Voltage: 2.99 V
Brady Statistic AP VP Percent: 0.36 %
Brady Statistic AP VS Percent: 98.83 %
Brady Statistic AS VP Percent: 0 %
Brady Statistic AS VS Percent: 0.8 %
Brady Statistic RA Percent Paced: 99.6 %
Brady Statistic RV Percent Paced: 0.36 %
Date Time Interrogation Session: 20241218232148
Implantable Lead Connection Status: 753985
Implantable Lead Connection Status: 753985
Implantable Lead Implant Date: 20190625
Implantable Lead Implant Date: 20190625
Implantable Lead Location: 753859
Implantable Lead Location: 753860
Implantable Lead Model: 3830
Implantable Lead Model: 5076
Implantable Pulse Generator Implant Date: 20190625
Lead Channel Impedance Value: 323 Ohm
Lead Channel Impedance Value: 361 Ohm
Lead Channel Impedance Value: 418 Ohm
Lead Channel Impedance Value: 456 Ohm
Lead Channel Pacing Threshold Amplitude: 0.375 V
Lead Channel Pacing Threshold Amplitude: 1 V
Lead Channel Pacing Threshold Pulse Width: 0.4 ms
Lead Channel Pacing Threshold Pulse Width: 0.4 ms
Lead Channel Sensing Intrinsic Amplitude: 1.25 mV
Lead Channel Sensing Intrinsic Amplitude: 1.25 mV
Lead Channel Sensing Intrinsic Amplitude: 4.625 mV
Lead Channel Sensing Intrinsic Amplitude: 4.625 mV
Lead Channel Setting Pacing Amplitude: 1.5 V
Lead Channel Setting Pacing Amplitude: 2.5 V
Lead Channel Setting Pacing Pulse Width: 0.4 ms
Lead Channel Setting Sensing Sensitivity: 1.2 mV
Zone Setting Status: 755011
Zone Setting Status: 755011

## 2023-03-15 NOTE — Progress Notes (Signed)
Remote pacemaker transmission.   

## 2023-03-19 ENCOUNTER — Ambulatory Visit: Payer: Medicare Other | Attending: Internal Medicine | Admitting: Internal Medicine

## 2023-03-19 ENCOUNTER — Encounter: Payer: Self-pay | Admitting: Internal Medicine

## 2023-03-19 VITALS — BP 134/80 | HR 62 | Ht 67.0 in | Wt 155.8 lb

## 2023-03-19 DIAGNOSIS — I495 Sick sinus syndrome: Secondary | ICD-10-CM | POA: Diagnosis not present

## 2023-03-19 DIAGNOSIS — Z950 Presence of cardiac pacemaker: Secondary | ICD-10-CM

## 2023-03-19 DIAGNOSIS — Z79899 Other long term (current) drug therapy: Secondary | ICD-10-CM | POA: Diagnosis not present

## 2023-03-19 DIAGNOSIS — R072 Precordial pain: Secondary | ICD-10-CM | POA: Diagnosis not present

## 2023-03-19 LAB — CUP PACEART INCLINIC DEVICE CHECK
Date Time Interrogation Session: 20250127094136
Implantable Lead Connection Status: 753985
Implantable Lead Connection Status: 753985
Implantable Lead Implant Date: 20190625
Implantable Lead Implant Date: 20190625
Implantable Lead Location: 753859
Implantable Lead Location: 753860
Implantable Lead Model: 3830
Implantable Lead Model: 5076
Implantable Pulse Generator Implant Date: 20190625
Lead Channel Pacing Threshold Amplitude: 0.5 V
Lead Channel Pacing Threshold Amplitude: 0.75 V
Lead Channel Pacing Threshold Pulse Width: 0.4 ms
Lead Channel Pacing Threshold Pulse Width: 1 ms

## 2023-03-19 MED ORDER — METOPROLOL TARTRATE 50 MG PO TABS: ORAL_TABLET | ORAL | 0 refills | Status: AC

## 2023-03-19 NOTE — Patient Instructions (Addendum)
Medication Instructions:  Your physician recommends that you continue on your current medications as directed. Please refer to the Current Medication list given to you today.  *If you need a refill on your cardiac medications before your next appointment, please call your pharmacy*   Lab Work: BMET today  If you have labs (blood work) drawn today and your tests are completely normal, you will receive your results only by: MyChart Message (if you have MyChart) OR A paper copy in the mail If you have any lab test that is abnormal or we need to change your treatment, we will call you to review the results.   Testing/Procedures: Your physician has requested that you have cardiac CT. Cardiac computed tomography (CT) is a painless test that uses an x-ray machine to take clear, detailed pictures of your heart. For further information please visit [email or web address] Please follow instruction sheet as given.      Follow-Up: At [facility], you and your health needs are our priority.  As part of our continuing mission to provide you with exceptional heart care, we have created designated Provider Care Teams.  These Care Teams include your primary Cardiologist (physician) and Advanced Practice Providers (APPs -  Physician Assistants and Nurse Practitioners) who all work together to provide you with the care you need, when you need it.  We recommend signing up for the patient portal called "MyChart".  Sign up information is provided on this After Visit Summary.  MyChart is used to connect with patients for Virtual Visits (Telemedicine).  Patients are able to view lab/test results, encounter notes, upcoming appointments, etc.  Non-urgent messages can be sent to your provider as well.   To learn more about what you can do with MyChart, go to [email or web address].    Your next appointment:   12 months  Other Instructions   Your cardiac CT will be scheduled at one of the below  locations:   [facility] [address] [place], [place] [address] [phone]    If scheduled at [facility], please arrive at the [facility] and Children's Entrance (Entrance C2) of [facility] 30 minutes prior to test start time. You can use the FREE valet parking offered at entrance C (encouraged to control the heart rate for the test)  Proceed to the [facility] Radiology Department (first floor) to check-in and test prep.  All radiology patients and guests should use entrance C2 at [facility], accessed from [facility], even though the hospital's physical address listed is [address]     Please follow these instructions carefully (unless otherwise directed):  An IV will be required for this test and Nitroglycerin will be given.  Hold all erectile dysfunction medications at least 3 days (72 hrs) prior to test. (Ie viagra, cialis, sildenafil, tadalafil, etc)   On the Night Before the Test: Be sure to Drink plenty of water. Do not consume any caffeinated/decaffeinated beverages or chocolate 12 hours prior to your test. Do not take any antihistamines 12 hours prior to your test.  On the Day of the Test: Drink plenty of water until 1 hour prior to the test. Do not eat any food 1 hour prior to test. You may take your regular medications prior to the test.  Take metoprolol (Lopressor) two hours prior to test. If you take Furosemide/Hydrochlorothiazide/Spironolactone/Chlorthalidone, please HOLD on the morning of the test. Patients who wear a continuous glucose monitor MUST remove the device prior to  scanning. FEMALES- please wear underwire-free bra if available, avoid dresses & tight clothing       After the Test: Drink plenty of water. After receiving IV contrast, you may experience a mild flushed feeling. This is normal. On occasion, you may experience a mild rash up to 24 hours after the test. This is not  dangerous. If this occurs, you can take Benadryl 25 mg and increase your fluid intake. If you experience trouble breathing, this can be serious. If it is severe call 911 IMMEDIATELY. If it is mild, please call our office.  We will call to schedule your test 2-4 weeks out understanding that some insurance companies will need an authorization prior to the service being performed.   For more information and frequently asked questions, please visit our website : [email or web address]  For non-scheduling related questions, please contact the cardiac imaging nurse navigator should you have any questions/concerns: Cardiac Imaging Nurse Navigators Direct Office Dial: [phone]   For scheduling needs, including cancellations and rescheduling, please call [place], [phone].

## 2023-03-19 NOTE — Progress Notes (Signed)
,      Patient Care Team: Emilio Aspen, MD as PCP - General (Internal Medicine)   HPI  Joanna Haas is a 78 y.o. female seen in follow-up for Medtronic pacemaker (His lead) implanted 6/19 for symptomatic sinus node dysfunction, bradycardia associated but not necessarily dependent ventricular tachycardia with monomorphic and polymorphic beats detected.  Orthostatic LH    The patient denies shortness of breath, nocturnal dyspnea, orthopnea or peripheral edema.  There have been no palpitations, or syncope.  Complains of non exertional chest pain described as a pinch underneath her left breast lasting minutes.  Can also be provoked by stress.  And relieved with the resolution of the stress.  Also has a different sensation when she "is.  Pulled her bra off "not really a pressure.Delphina Cahill around [place] a bunch without difficulty even up hills.   10/21 Echo 60-65%   6/19 Echo   50-55 %   10/21 Echo  60-65%     Date Cr K Hgb LDL  6/19 0.65 4.5 14.2    12/21 0.73 4.4 15   2/24  4.4 14.8           Past Medical History:  Diagnosis Date   Adenomatous polyp 02/2017   Dr. Dulce Sellar   Arthritis    Depression    Hypercholesterolemia    goal LDL less the 160    Past Surgical History:  Procedure Laterality Date   PACEMAKER IMPLANT N/A 08/13/2017   Procedure: PACEMAKER IMPLANT;  Surgeon: Duke Salvia, MD;  Location: [facility] INVASIVE CV LAB;  Service: Cardiovascular;  Laterality: N/A;   REDUCTION MAMMAPLASTY Bilateral 2004    No outpatient medications have been marked as taking for the 03/19/23 encounter (Office Visit) with Duke Salvia, MD.    Allergies  Allergen Reactions   Calcium + Vitamin D3 [Calcium Carb-Cholecalciferol] Other (See Comments)    Upset stomach   Citalopram Hydrobromide Other (See Comments)    QT prolonged   Simvastatin Other (See Comments)    Restless legs     Review of Systems negative except from HPI and PMH Please see the history of present  illness. (+) Shortness of Breath (+)  All other systems are reviewed and negative.     Physical Exam BP 134/80   Ht 5\' 7"  (1.702 m)   Wt 155 lb 12.8 oz (70.7 kg)   BMI 24.40 kg/m  Well developed and well nourished in no acute distress HENT normal Neck supple with JVP-flat Clear Device pocket well healed; without hematoma or erythema.  There is no tethering  Regular rate and rhythm, no  gallop No  murmur Abd-soft with active BS No Clubbing cyanosis  edema Skin-warm and dry A & Oriented  Grossly normal sensory and motor function  ECG atrial pacing at 62 Intervals 18/11/41  Device function is normal. Programming changes pulse width was increased to 1 ms given his bundle lead See Paceart for details    11/21/2019 ECG: A pacing 82 20/14/39     Assessment and  Plan  Sinus node dysfunction   Ventricular tachycardia nonsustained-monomorphic   Tachycardia nonsustained-polymorphic possibly ventricular but probably supraventricular   Syncope-neurally mediated   Orthostatic intolerance  Pacemaker, non selective His Medtronic   Chest pain atypical    Sinus node dysfunction persists.  Heart rate excursion is relatively reasonable although it is a truncated at the top of her ADL rate.  This not withstanding, her traversing of [place] was without limitation.  We discussed changing her threshold but have left it as they are for now.  I think this is reasonable particularly in light of the concern of her atypical chest pain with his stress provocation concerning for ischemia.  Will take a CT scan, she may have INOCA and we discussed empiric therapies in the event that nothing is seen on CT scan.  Ortho static lightheadedness persists albeit mild

## 2023-03-20 LAB — BASIC METABOLIC PANEL
BUN/Creatinine Ratio: 19 (ref 12–28)
BUN: 15 mg/dL (ref 8–27)
CO2: 18 mmol/L — ABNORMAL LOW (ref 20–29)
Calcium: 10.1 mg/dL (ref 8.7–10.3)
Chloride: 101 mmol/L (ref 96–106)
Creatinine, Ser: 0.77 mg/dL (ref 0.57–1.00)
Glucose: 76 mg/dL (ref 70–99)
Potassium: 4.3 mmol/L (ref 3.5–5.2)
Sodium: 140 mmol/L (ref 134–144)
eGFR: 79 mL/min/{1.73_m2} (ref >59)

## 2023-03-29 ENCOUNTER — Telehealth (HOSPITAL_COMMUNITY): Payer: Self-pay | Admitting: *Deleted

## 2023-03-29 NOTE — Telephone Encounter (Signed)
 Reaching out to patient to offer assistance regarding upcoming cardiac imaging study; pt verbalizes understanding of appt date/time, parking situation and where to check in, pre-test NPO status and medications ordered, and verified current allergies; name and call back number provided for further questions should they arise  Chantal Requena RN Navigator Cardiac Imaging [facility] [phone] office [phone] cell  Patient to take 50mg  metoprolol  tartrate two hours prior to her cardiac CT scan. She is aware to arrive at 3 PM.

## 2023-04-02 ENCOUNTER — Ambulatory Visit (HOSPITAL_COMMUNITY)
Admission: RE | Admit: 2023-04-02 | Discharge: 2023-04-02 | Disposition: A | Discharge status: Home / Self Care | Payer: Medicare Other | Source: Ambulatory Visit | Attending: Internal Medicine | Admitting: Internal Medicine

## 2023-04-02 DIAGNOSIS — R072 Precordial pain: Secondary | ICD-10-CM | POA: Diagnosis not present

## 2023-04-02 MED ORDER — NITROGLYCERIN 0.4 MG SL SUBL
0.8000 mg | SUBLINGUAL_TABLET | Freq: Once | SUBLINGUAL | Status: AC
  Administered 2023-04-02: 0.8 mg via SUBLINGUAL

## 2023-04-02 MED ORDER — IOHEXOL 350 MG/ML SOLN
95.0000 mL | Freq: Once | INTRAVENOUS | Status: AC | PRN
  Administered 2023-04-02: 95 mL via INTRAVENOUS

## 2023-04-02 MED ORDER — NITROGLYCERIN 0.4 MG SL SUBL
SUBLINGUAL_TABLET | SUBLINGUAL | Status: AC
  Filled 2023-04-02: qty 2

## 2023-04-10 ENCOUNTER — Encounter: Payer: Self-pay | Admitting: Internal Medicine

## 2023-04-10 DIAGNOSIS — Z1331 Encounter for screening for depression: Secondary | ICD-10-CM | POA: Diagnosis not present

## 2023-04-10 DIAGNOSIS — Z5181 Encounter for therapeutic drug level monitoring: Secondary | ICD-10-CM | POA: Diagnosis not present

## 2023-04-10 DIAGNOSIS — R931 Abnormal findings on diagnostic imaging of heart and coronary circulation: Secondary | ICD-10-CM

## 2023-04-10 DIAGNOSIS — R072 Precordial pain: Secondary | ICD-10-CM

## 2023-04-10 DIAGNOSIS — N393 Stress incontinence (female) (male): Secondary | ICD-10-CM | POA: Diagnosis not present

## 2023-04-10 DIAGNOSIS — K219 Gastro-esophageal reflux disease without esophagitis: Secondary | ICD-10-CM | POA: Diagnosis not present

## 2023-04-10 DIAGNOSIS — E7800 Pure hypercholesterolemia, unspecified: Secondary | ICD-10-CM | POA: Diagnosis not present

## 2023-04-10 DIAGNOSIS — R0789 Other chest pain: Secondary | ICD-10-CM | POA: Diagnosis not present

## 2023-04-10 DIAGNOSIS — E559 Vitamin D deficiency, unspecified: Secondary | ICD-10-CM | POA: Diagnosis not present

## 2023-04-10 DIAGNOSIS — Z79899 Other long term (current) drug therapy: Secondary | ICD-10-CM | POA: Diagnosis not present

## 2023-04-10 DIAGNOSIS — Z0000 Encounter for general adult medical examination without abnormal findings: Secondary | ICD-10-CM | POA: Diagnosis not present

## 2023-04-16 NOTE — Addendum Note (Signed)
 Addended by: Alois Cliche on: 04/16/2023 09:58 AM   Modules accepted: Orders

## 2023-04-16 NOTE — Addendum Note (Signed)
 Addended by: Duke Salvia on: 04/16/2023 07:06 PM   Modules accepted: Orders

## 2023-04-20 ENCOUNTER — Telehealth (HOSPITAL_COMMUNITY): Payer: Self-pay | Admitting: *Deleted

## 2023-04-20 NOTE — Telephone Encounter (Signed)
 Patient given detailed instructions per Myocardial Perfusion Study Information Sheet for the test on 04/23/2023 at 7:45. Patient notified to arrive 15 minutes early and that it is imperative to arrive on time for appointment to keep from having the test rescheduled.  If you need to cancel or reschedule your appointment, please call the office within 24 hours of your appointment. . Patient verbalized understanding.Daneil Dolin

## 2023-04-23 ENCOUNTER — Ambulatory Visit (HOSPITAL_COMMUNITY): Payer: Medicare Other | Attending: Internal Medicine

## 2023-04-23 DIAGNOSIS — R072 Precordial pain: Secondary | ICD-10-CM | POA: Insufficient documentation

## 2023-04-23 DIAGNOSIS — R931 Abnormal findings on diagnostic imaging of heart and coronary circulation: Secondary | ICD-10-CM | POA: Insufficient documentation

## 2023-04-23 LAB — MYOCARDIAL PERFUSION IMAGING
LV dias vol: 59 mL (ref 46–106)
LV sys vol: 22 mL
Nuc Stress EF: 62 %
Peak HR: 86 {beats}/min
Rest HR: 71 {beats}/min
Rest Nuclear Isotope Dose: 10.9 mCi
SDS: 3
SRS: 1
SSS: 4
ST Depression (mm): 0 mm
Stress Nuclear Isotope Dose: 32.7 mCi
TID: 1.14

## 2023-04-23 MED ORDER — TECHNETIUM TC 99M TETROFOSMIN IV KIT
32.7000 | PACK | Freq: Once | INTRAVENOUS | Status: AC | PRN
  Administered 2023-04-23: 32.7 via INTRAVENOUS

## 2023-04-23 MED ORDER — TECHNETIUM TC 99M TETROFOSMIN IV KIT
10.9000 | PACK | Freq: Once | INTRAVENOUS | Status: AC | PRN
  Administered 2023-04-23: 10.9 via INTRAVENOUS

## 2023-04-23 MED ORDER — REGADENOSON 0.4 MG/5ML IV SOLN
0.4000 mg | Freq: Once | INTRAVENOUS | Status: AC
  Administered 2023-04-23: 0.4 mg via INTRAVENOUS

## 2023-05-04 DIAGNOSIS — B9681 Helicobacter pylori [H. pylori] as the cause of diseases classified elsewhere: Secondary | ICD-10-CM | POA: Diagnosis not present

## 2023-05-04 DIAGNOSIS — R109 Unspecified abdominal pain: Secondary | ICD-10-CM | POA: Diagnosis not present

## 2023-05-04 DIAGNOSIS — K219 Gastro-esophageal reflux disease without esophagitis: Secondary | ICD-10-CM | POA: Diagnosis not present

## 2023-05-04 DIAGNOSIS — Z860100 Personal history of colon polyps, unspecified: Secondary | ICD-10-CM | POA: Diagnosis not present

## 2023-05-10 ENCOUNTER — Ambulatory Visit (INDEPENDENT_AMBULATORY_CARE_PROVIDER_SITE_OTHER): Payer: Medicare Other

## 2023-05-10 ENCOUNTER — Ambulatory Visit: Payer: Medicare Other | Attending: Internal Medicine | Admitting: Audiology

## 2023-05-10 DIAGNOSIS — I495 Sick sinus syndrome: Secondary | ICD-10-CM | POA: Diagnosis not present

## 2023-05-10 DIAGNOSIS — H90A21 Sensorineural hearing loss, unilateral, right ear, with restricted hearing on the contralateral side: Secondary | ICD-10-CM | POA: Diagnosis not present

## 2023-05-10 DIAGNOSIS — H90A32 Mixed conductive and sensorineural hearing loss, unilateral, left ear with restricted hearing on the contralateral side: Secondary | ICD-10-CM | POA: Insufficient documentation

## 2023-05-10 LAB — CUP PACEART REMOTE DEVICE CHECK
Battery Remaining Longevity: 97 mo
Battery Voltage: 2.99 V
Brady Statistic AP VP Percent: 0.57 %
Brady Statistic AP VS Percent: 98.5 %
Brady Statistic AS VP Percent: 0 %
Brady Statistic AS VS Percent: 0.92 %
Brady Statistic RA Percent Paced: 99.61 %
Brady Statistic RV Percent Paced: 0.57 %
Date Time Interrogation Session: 20250319223410
Implantable Lead Connection Status: 753985
Implantable Lead Connection Status: 753985
Implantable Lead Implant Date: 20190625
Implantable Lead Implant Date: 20190625
Implantable Lead Location: 753859
Implantable Lead Location: 753860
Implantable Lead Model: 3830
Implantable Lead Model: 5076
Implantable Pulse Generator Implant Date: 20190625
Lead Channel Impedance Value: 285 Ohm
Lead Channel Impedance Value: 361 Ohm
Lead Channel Impedance Value: 399 Ohm
Lead Channel Impedance Value: 437 Ohm
Lead Channel Pacing Threshold Amplitude: 0.375 V
Lead Channel Pacing Threshold Amplitude: 1 V
Lead Channel Pacing Threshold Pulse Width: 0.4 ms
Lead Channel Pacing Threshold Pulse Width: 0.4 ms
Lead Channel Sensing Intrinsic Amplitude: 0.5 mV
Lead Channel Sensing Intrinsic Amplitude: 0.5 mV
Lead Channel Sensing Intrinsic Amplitude: 4.625 mV
Lead Channel Sensing Intrinsic Amplitude: 4.625 mV
Lead Channel Setting Pacing Amplitude: 1.5 V
Lead Channel Setting Pacing Amplitude: 2.5 V
Lead Channel Setting Pacing Pulse Width: 1 ms
Lead Channel Setting Sensing Sensitivity: 1.2 mV
Zone Setting Status: 755011
Zone Setting Status: 755011

## 2023-05-10 NOTE — Procedures (Addendum)
 Outpatient Audiology and [facility] [address] [place], [place]  [address] [phone]  AUDIOLOGICAL  EVALUATION  NAME: Joanna Haas     DOB:   Jul 29, 1945      MRN: [id number]                                                                                     DATE: 05/10/2023     REFERENT: Emilio Aspen, MD STATUS: Outpatient DIAGNOSIS:  Mild to severe  mixed hearing loss   History: Joanna Haas was seen for an audiological evaluation due to a perceived gradual decrease in hearing.  Joanna Haas notes that Joanna Haas watches TV at a loud volume and needs captions. Joanna Haas also stated that Joanna Haas has difficulty understanding speech in background noise. Joanna Haas reported that Joanna Haas hears better with her right ear than her left. Joanna Haas also stated that Joanna Haas feels that Joanna Haas has always had poorer hearing than her peers in even childhood, especially after the removal of her tonsil and adenoids. Joanna Haas did state that Joanna Haas saw an ENT approximately 10 years ago due to her sister's history of acoustic neuroma. Imaging was completed and came back negative, and Joanna Haas has not been seen by ENT since then. Joanna Haas once utilized hearing aids a few years ago, but did not see benefit and stated that caused her ear to itch.  Joanna Haas endorsed  intermittent tinnitus at night in her left ear only which Joanna Haas describes as a "motor" like sound, however Joanna Haas does not find it bothersome. Joanna Haas denied any history of hazardous noise exposure, aural pain, fullness or dizziness.  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 Test results are consistent with normal hearing sensitivity from 250 -2000 Hz sloping to mild to severe predominately sensorineural hearing loss with a conductive component at  250 Hz in the right ear. The left ear is consistent with normal hearing sensitivity from 250 -750 Hz sloping to a mild to severe mixed hearing loss with conductive components at 250, 1000, and 4000 Hz. Speech Recognition Thresholds were obtained at 25 dB HL in the right ear and at 40  dB HL in the left ear. Word Recognition Testing was completed at 65 dB HL in the right ear and 80 dB HL in the left ear and [name] scored 96% and 76%, respectively.    Results:  The test results were reviewed with Joanna Haas and Joanna Haas has normal hearing sensitivity from 250 -2000 Hz sloping to mild to severe predominately sensorineural hearing loss with a conductive component at 250 Hz in the right ear. The left ear is consistent with normal hearing sensitivity from 250 -750 Hz sloping to a mild to severe mixed hearing loss with conductive components at 250, 1000, and 4000 Hz. Joanna Haas also has an asymmetry in her Word Recognition testing scoring a 96% in the right ear and 76% in her left ear.  Joanna Haas's tympanometry revealed hypermobility of both tympanic membranes.  Joanna Haas was counseled on seeing ENT due to the  conductive  and asymmetric nature of her hearing loss. Joanna Haas is a candidate for hearing aids in both ears. It was discussed on how to contact her insurance to see if Joanna Haas has a hearing aid benefit, if wishes to pursue hearing aids. A list of hearing aid and ENT providers was also provided.   Recommendations: 1.   ENT re-evaluation due to asymmetry and conductive components in the left ear.  2.   Consistent hearing aid use in both ears 3.   Annual monitoring of hearing loss    40 minutes spent testing and counseling on results.   If you have any questions please feel free to contact me at [phone].  Marton Redwood Audiologist, Au.D., CCC-A 05/10/2023  10:25 AM  Brendia Sacks, B.S.  Audiology Student   During this evaluation, the Audiologist was present, participating in and directing the student.  Marton Redwood, Au.D.,  CCC-A Audiologist  I agree with the following procedure note after reviewing documentation. This session was performed under the supervision of a licensed clinician.  During this session, the Audiologist  was present, participating in and directing the treatment.   Cc: Emilio Aspen, MD

## 2023-06-04 ENCOUNTER — Encounter: Payer: Self-pay | Admitting: Internal Medicine

## 2023-06-12 DIAGNOSIS — Z5181 Encounter for therapeutic drug level monitoring: Secondary | ICD-10-CM | POA: Diagnosis not present

## 2023-06-21 NOTE — Progress Notes (Signed)
 Remote pacemaker transmission.

## 2023-07-05 DIAGNOSIS — A048 Other specified bacterial intestinal infections: Secondary | ICD-10-CM | POA: Diagnosis not present

## 2023-07-18 ENCOUNTER — Other Ambulatory Visit: Payer: Self-pay | Admitting: Internal Medicine

## 2023-07-18 DIAGNOSIS — Z0000 Encounter for general adult medical examination without abnormal findings: Secondary | ICD-10-CM

## 2023-07-18 DIAGNOSIS — H40053 Ocular hypertension, bilateral: Secondary | ICD-10-CM | POA: Diagnosis not present

## 2023-07-18 DIAGNOSIS — H35371 Puckering of macula, right eye: Secondary | ICD-10-CM | POA: Diagnosis not present

## 2023-07-18 DIAGNOSIS — Z961 Presence of intraocular lens: Secondary | ICD-10-CM | POA: Diagnosis not present

## 2023-07-26 ENCOUNTER — Ambulatory Visit
Admission: RE | Admit: 2023-07-26 | Discharge: 2023-07-26 | Disposition: A | Discharge status: Home / Self Care | Source: Ambulatory Visit | Attending: Internal Medicine | Admitting: Internal Medicine

## 2023-07-26 DIAGNOSIS — Z1231 Encounter for screening mammogram for malignant neoplasm of breast: Secondary | ICD-10-CM | POA: Diagnosis not present

## 2023-07-26 DIAGNOSIS — Z0000 Encounter for general adult medical examination without abnormal findings: Secondary | ICD-10-CM

## 2023-08-07 ENCOUNTER — Ambulatory Visit (INDEPENDENT_AMBULATORY_CARE_PROVIDER_SITE_OTHER): Admitting: Otolaryngology

## 2023-08-07 ENCOUNTER — Encounter (INDEPENDENT_AMBULATORY_CARE_PROVIDER_SITE_OTHER): Payer: Self-pay | Admitting: Otolaryngology

## 2023-08-07 VITALS — BP 135/82 | HR 84

## 2023-08-07 DIAGNOSIS — H9313 Tinnitus, bilateral: Secondary | ICD-10-CM

## 2023-08-07 DIAGNOSIS — H6121 Impacted cerumen, right ear: Secondary | ICD-10-CM | POA: Diagnosis not present

## 2023-08-07 DIAGNOSIS — H918X3 Other specified hearing loss, bilateral: Secondary | ICD-10-CM

## 2023-08-07 DIAGNOSIS — H903 Sensorineural hearing loss, bilateral: Secondary | ICD-10-CM | POA: Diagnosis not present

## 2023-08-07 NOTE — Patient Instructions (Signed)
 I have ordered an imaging study for you to complete prior to your next visit. Please call [facility] Scheduling at [phone] to schedule your imaging if you have not received a call within 24 hours. If you are unable to complete your imaging study prior to your next scheduled visit please call our office to let us know.

## 2023-08-07 NOTE — Progress Notes (Signed)
 Dear Dr. Teofilo Fellers, Here is my assessment for our mutual patient, Joanna Haas. Thank you for allowing me the opportunity to care for your patient. Please do not hesitate to contact me should you have any other questions. Sincerely, Dr. Milon Aloe  Otolaryngology Clinic Note Referring provider: Dr. Teofilo Fellers HPI:  Joanna Haas is a 78 y.o. female kindly referred by Dr. Teofilo Fellers for evaluation of bilateral hearing loss  Initial visit (07/2023): Patient reports: noted hearing loss for many years, gradually worse. About 10 years ago (told she had lost 50% of hearing in left at that time), she had a hearing test and MRI (sister had acoustic neuroma). She got a HA, but did not use consistently. Hearing is worst in social situations and in background noise. Left worse than right. No sudden change in hearing. Intermittent tinnitus at night, non-pulsatile. No headaches, facial numbness/weakness. No h/o ear infections as adult. Some significant noise exposure at work (3 years).  Patient denies: ear pain, fullness, vertigo, drainage Patient additionally denies: deep pain in ear canal, eustachian tube symptoms such as popping crackling, sensitive to pressure changes Patient also denies barotrauma, vestibular suppressant use, ototoxic medication use Prior ear surgery: no  ENT Surgery: Tonsil/Adenoidectomy Personal or FHx of bleeding dz or anesthesia difficulty: no  AP/AC: ASA 81  Tobacco: no  PMHx: GERD, Sinus node dysfunction s/p Pacemaker  Independent Review of Additional Tests or Records:  Dr. Teofilo Fellers Referral notes reviewed and uploaded or available in chart in media tab (05/28/2023): worsening hearing loss, seen by [facility]. Noted left ear with HF SNHL. Noted cerumen impaction as well Labs: CBC and CMP 11/12/2021: WBC 5.6; BUN/Cr wnl Joanna Haas Audio (05/10/2023):  Audio 04/2023: noted downsloping HL b/l, with Ad/Ad tymps; Right SNHL essentially with left with ~20dB ABG mid freq and then  asymmetric SNHL. WRT 96% AD at 65dB, 76% AS at 80dB   PMH/Meds/All/SocHx/FamHx/ROS:   Past Medical History:  Diagnosis Date   Adenomatous polyp 02/2017   Dr. Kimble Pennant   Arthritis    Depression    Hypercholesterolemia    goal LDL less the 160     Past Surgical History:  Procedure Laterality Date   PACEMAKER IMPLANT N/A 08/13/2017   Procedure: PACEMAKER IMPLANT;  Surgeon: Verona Goodwill, MD;  Location: [facility] INVASIVE CV LAB;  Service: Cardiovascular;  Laterality: N/A;   REDUCTION MAMMAPLASTY Bilateral 2004    Family History  Problem Relation Age of Onset   Cancer Mother    Cancer Father    Cancer Sister    Cancer Brother    Breast cancer Neg Hx      Social Connections: Not on file      Current Outpatient Medications:    aspirin EC 81 MG tablet, Take 81 mg by mouth daily. Swallow whole., Disp: , Rfl:    atorvastatin  (LIPITOR) 20 MG tablet, Take 40 mg by mouth daily., Disp: , Rfl:    cholecalciferol (VITAMIN D3) 25 MCG (1000 UNIT) tablet, Take 1,000 Units by mouth daily., Disp: , Rfl:    Cyanocobalamin  (VITAMIN B 12 PO), Take by mouth. daily, Disp: , Rfl:    Multiple Vitamin (MULTIVITAMIN ADULT PO), Take by mouth. daily, Disp: , Rfl:    omeprazole (PRILOSEC) 20 MG capsule, Take 20 mg by mouth daily., Disp: , Rfl:    CALCIUM  CARBONATE ANTACID PO, Take 1 tablet by mouth as needed for heartburn. (Patient not taking: Reported on 08/07/2023), Disp: , Rfl:    fluticasone  (FLONASE ) 50 MCG/ACT nasal spray, Place 2  sprays into both nostrils daily as needed for allergies., Disp: , Rfl:    ibuprofen  (ADVIL ,MOTRIN ) 200 MG tablet, Take 200 mg by mouth every 6 (six) hours as needed for headache, mild pain, moderate pain or cramping., Disp: , Rfl:    metoprolol  tartrate (LOPRESSOR ) 50 MG tablet, Take one tablet 2 hours prior to testing., Disp: 1 tablet, Rfl: 0   Physical Exam:   BP 135/82   Pulse 84   SpO2 95%   Salient findings:  CN II-XII intact Given history and complaints, ear  microscopy was indicated and performed for evaluation with findings as below in physical exam section and in procedures; right cerumen impaction, after clearance, Bilateral EAC clear and TM intact with well pneumatized middle ear spaces Weber 512: mid Rinne 512: AC > BC b/l  Anterior rhinoscopy: Septum intact; bilateral inferior turbinates without significant hypertrophy No lesions of oral cavity/oropharynx No obviously palpable neck masses/lymphadenopathy/thyromegaly No respiratory distress or stridor  Seprately Identifiable Procedures:  Prior to initiating any procedures, risks/benefits/alternatives were explained to the patient and verbal consent obtained. Procedure: Bilateral ear microscopy and cerumen removal using microscope (CPT [id number]) - Mod 25 Pre-procedure diagnosis: Cerumen impaction right external ears Post-procedure diagnosis: same Indication: right cerumen impaction; given patient's otologic complaints and history as well as for improved and comprehensive examination of external ear and tympanic membrane, bilateral otologic examination using microscope was performed and impacted cerumen removed  Procedure: Patient was placed semi-recumbent. Both ear canals were examined using the microscope with findings above. Impacted Cerumen removed on right using curette with improvement in EAC examination and patency. Patient tolerated the procedure well.      Impression & Plans:  Joanna Haas is a 78 y.o. female with:  1. Asymmetrical hearing loss   2. Sensorineural hearing loss (SNHL) of both ears   3. Bilateral tinnitus   4. Impacted cerumen of right ear    Noted worsening hearing. We discussed her audio - small conductive component on left ~1000 Hz but given otherwise SNHL, would recommend HA rather than any surgical intervention. As such, will hold off on CT Otherwise, for her asymmetry, will get MRI IAC Cleared for HA F/u in 8 weeks  See below regarding exact medications  prescribed this encounter including dosages and route: No orders of the defined types were placed in this encounter.     Thank you for allowing me the opportunity to care for your patient. Please do not hesitate to contact me should you have any other questions.  Sincerely, Milon Aloe, MD Otolaryngologist (ENT), [facility] Phone: [phone] Fax: [phone]  08/07/2023, 11:06 AM   MDM:  Level 4 - 99204 Complexity/Problems addressed: mod - chronic worsening problem Data complexity: mod - independent review of notes, lab, ordering test - Morbidity: low

## 2023-08-09 ENCOUNTER — Ambulatory Visit (INDEPENDENT_AMBULATORY_CARE_PROVIDER_SITE_OTHER): Payer: Medicare Other

## 2023-08-09 DIAGNOSIS — I495 Sick sinus syndrome: Secondary | ICD-10-CM | POA: Diagnosis not present

## 2023-08-09 DIAGNOSIS — L570 Actinic keratosis: Secondary | ICD-10-CM | POA: Diagnosis not present

## 2023-08-09 LAB — CUP PACEART REMOTE DEVICE CHECK
Battery Remaining Longevity: 95 mo
Battery Voltage: 2.99 V
Brady Statistic AP VP Percent: 0.35 %
Brady Statistic AP VS Percent: 99.12 %
Brady Statistic AS VP Percent: 0 %
Brady Statistic AS VS Percent: 0.53 %
Brady Statistic RA Percent Paced: 99.71 %
Brady Statistic RV Percent Paced: 0.35 %
Date Time Interrogation Session: 20250619050017
Implantable Lead Connection Status: 753985
Implantable Lead Connection Status: 753985
Implantable Lead Implant Date: 20190625
Implantable Lead Implant Date: 20190625
Implantable Lead Location: 753859
Implantable Lead Location: 753860
Implantable Lead Model: 3830
Implantable Lead Model: 5076
Implantable Pulse Generator Implant Date: 20190625
Lead Channel Impedance Value: 304 Ohm
Lead Channel Impedance Value: 342 Ohm
Lead Channel Impedance Value: 399 Ohm
Lead Channel Impedance Value: 418 Ohm
Lead Channel Pacing Threshold Amplitude: 0.375 V
Lead Channel Pacing Threshold Amplitude: 1.125 V
Lead Channel Pacing Threshold Pulse Width: 0.4 ms
Lead Channel Pacing Threshold Pulse Width: 0.4 ms
Lead Channel Sensing Intrinsic Amplitude: 0.875 mV
Lead Channel Sensing Intrinsic Amplitude: 0.875 mV
Lead Channel Sensing Intrinsic Amplitude: 3.125 mV
Lead Channel Sensing Intrinsic Amplitude: 3.125 mV
Lead Channel Setting Pacing Amplitude: 1.5 V
Lead Channel Setting Pacing Amplitude: 2.5 V
Lead Channel Setting Pacing Pulse Width: 1 ms
Lead Channel Setting Sensing Sensitivity: 1.2 mV
Zone Setting Status: 755011
Zone Setting Status: 755011

## 2023-08-10 ENCOUNTER — Ambulatory Visit: Payer: Self-pay | Admitting: Cardiology

## 2023-08-20 DIAGNOSIS — E7800 Pure hypercholesterolemia, unspecified: Secondary | ICD-10-CM | POA: Diagnosis not present

## 2023-08-20 DIAGNOSIS — F325 Major depressive disorder, single episode, in full remission: Secondary | ICD-10-CM | POA: Diagnosis not present

## 2023-08-22 DIAGNOSIS — K080 Exfoliation of teeth due to systemic causes: Secondary | ICD-10-CM | POA: Diagnosis not present

## 2023-08-23 NOTE — CV Procedure (Signed)
  Device system confirmed to be MRI conditional, with implant date > 6 weeks ago, and no evidence of abandoned or epicardial leads in review of most recent CXR  Device last cleared by EP Provider:  Daphne Barrack 08/23/23  Clearance is good through for 1 year as long as parameters remain stable at time of check. If pt undergoes a cardiac device procedure during that time, they should be re-cleared.   Tachy-therapies to be programmed off if applicable with device back to pre-MRI settings after completion of exam.  Medtronic - Programming recommendation received through Medtronic App/Tablet  Izetta CHRISTELLA Linen, RT  08/23/2023 1:25 PM

## 2023-08-28 ENCOUNTER — Ambulatory Visit (HOSPITAL_COMMUNITY)
Admission: RE | Admit: 2023-08-28 | Discharge: 2023-08-28 | Disposition: A | Discharge status: Home / Self Care | Source: Ambulatory Visit | Attending: Otolaryngology | Admitting: Otolaryngology

## 2023-08-28 DIAGNOSIS — H9190 Unspecified hearing loss, unspecified ear: Secondary | ICD-10-CM | POA: Diagnosis not present

## 2023-08-28 DIAGNOSIS — H918X3 Other specified hearing loss, bilateral: Secondary | ICD-10-CM | POA: Insufficient documentation

## 2023-08-28 DIAGNOSIS — I6782 Cerebral ischemia: Secondary | ICD-10-CM | POA: Diagnosis not present

## 2023-08-28 MED ORDER — GADOBUTROL 1 MMOL/ML IV SOLN
7.0000 mL | Freq: Once | INTRAVENOUS | Status: AC | PRN
  Administered 2023-08-28: 7 mL via INTRAVENOUS

## 2023-10-01 DIAGNOSIS — J3081 Allergic rhinitis due to animal (cat) (dog) hair and dander: Secondary | ICD-10-CM | POA: Diagnosis not present

## 2023-10-01 DIAGNOSIS — J40 Bronchitis, not specified as acute or chronic: Secondary | ICD-10-CM | POA: Diagnosis not present

## 2023-10-04 ENCOUNTER — Encounter (INDEPENDENT_AMBULATORY_CARE_PROVIDER_SITE_OTHER): Payer: Self-pay | Admitting: Otolaryngology

## 2023-10-04 ENCOUNTER — Ambulatory Visit (INDEPENDENT_AMBULATORY_CARE_PROVIDER_SITE_OTHER): Admitting: Otolaryngology

## 2023-10-04 VITALS — BP 102/72 | HR 86 | Ht 67.0 in | Wt 159.0 lb

## 2023-10-04 DIAGNOSIS — H903 Sensorineural hearing loss, bilateral: Secondary | ICD-10-CM

## 2023-10-04 DIAGNOSIS — H9313 Tinnitus, bilateral: Secondary | ICD-10-CM

## 2023-10-04 DIAGNOSIS — H918X3 Other specified hearing loss, bilateral: Secondary | ICD-10-CM

## 2023-10-04 NOTE — Progress Notes (Signed)
 Dear Dr. Charlott, Here is my assessment for our mutual patient, Joanna Haas. Thank you for allowing me the opportunity to care for your patient. Please do not hesitate to contact me should you have any other questions. Sincerely, Dr. Eldora Blanch  Otolaryngology Clinic Note Referring provider: Dr. Charlott HPI:  Joanna Haas is a 78 y.o. female kindly referred by Dr. Charlott for evaluation of bilateral hearing loss  Initial visit (07/2023): Patient reports: noted hearing loss for many years, gradually worse. About 10 years ago (told she had lost 50% of hearing in left at that time), she had a hearing test and MRI (sister had acoustic neuroma). She got a HA, but did not use consistently. Hearing is worst in social situations and in background noise. Left worse than right. No sudden change in hearing. Intermittent tinnitus at night, non-pulsatile. No headaches, facial numbness/weakness. No h/o ear infections as adult. Some significant noise exposure at work (3 years).  Patient denies: ear pain, fullness, vertigo, drainage Patient additionally denies: deep pain in ear canal, eustachian tube symptoms such as popping crackling, sensitive to pressure changes Patient also denies barotrauma, vestibular suppressant use, ototoxic medication use Prior ear surgery: no  --------------------------------------------------------- 10/04/2023 Hearing is stable. No facial weakness or numbness. She did have an MRI. No ear drainage, numbness, weakness in face or extremities.  She is interested in amplification. Otherwise denies any changes from ear standpoint. Recovering from bronchitis. We discussed her MRI  ENT Surgery: Tonsil/Adenoidectomy Personal or FHx of bleeding dz or anesthesia difficulty: no  AP/AC: ASA 81  Tobacco: no  PMHx: GERD, Sinus node dysfunction s/p Pacemaker  Independent Review of Additional Tests or Records:  Dr. Charlott Referral notes reviewed and uploaded or available in chart in  media tab (05/28/2023): worsening hearing loss, seen by [facility]. Noted left ear with HF SNHL. Noted cerumen impaction as well Labs: CBC and CMP 11/12/2021: WBC 5.6; BUN/Cr wnl Darryle Posey Audio (05/10/2023):  Audio 04/2023: noted downsloping HL b/l, with Ad/Ad tymps; Right SNHL essentially with left with ~20dB ABG mid freq and then asymmetric SNHL. WRT 96% AD at 65dB, 76% AS at 80dB   MRI IAC 08/28/2023 independently interpreted with respect to ears: no mastoid or ME effusion; unable to appreciate obvious CPA angle/IAC lesion but on MRI series 3 img 13, slight hyperintensity (vessel?) which I have messaged the reading radiologist to ensure no pathology/discuss further  PMH/Meds/All/SocHx/FamHx/ROS:   Past Medical History:  Diagnosis Date   Adenomatous polyp 02/2017   Dr. Burnette   Arthritis    Depression    Hypercholesterolemia    goal LDL less the 160     Past Surgical History:  Procedure Laterality Date   PACEMAKER IMPLANT N/A 08/13/2017   Procedure: PACEMAKER IMPLANT;  Surgeon: Fernande Elspeth BROCKS, MD;  [facility] INVASIVE CV LAB;  Service: Cardiovascular;  Laterality: N/A;   REDUCTION MAMMAPLASTY Bilateral 2004    Family History  Problem Relation Age of Onset   Cancer Mother    Cancer Father    Cancer Sister    Cancer Brother    Breast cancer Neg Hx      Social Connections: Not on file      Current Outpatient Medications:    aspirin EC 81 MG tablet, Take 81 mg by mouth daily. Swallow whole., Disp: , Rfl:    atorvastatin (LIPITOR) 20 MG tablet, Take 40 mg by mouth daily., Disp: , Rfl:    cholecalciferol (VITAMIN D3) 25 MCG (1000 UNIT) tablet, Take 1,000 Units  by mouth daily., Disp: , Rfl:    Cyanocobalamin (VITAMIN B 12 PO), Take by mouth. daily, Disp: , Rfl:    Multiple Vitamin (MULTIVITAMIN ADULT PO), Take by mouth. daily, Disp: , Rfl:    omeprazole (PRILOSEC) 20 MG capsule, Take 20 mg by mouth daily., Disp: , Rfl:    CALCIUM CARBONATE ANTACID PO, Take 1 tablet by  mouth as needed for heartburn. (Patient not taking: Reported on 10/04/2023), Disp: , Rfl:    fluticasone (FLONASE) 50 MCG/ACT nasal spray, Place 2 sprays into both nostrils daily as needed for allergies. (Patient not taking: Reported on 10/04/2023), Disp: , Rfl:    ibuprofen (ADVIL,MOTRIN) 200 MG tablet, Take 200 mg by mouth every 6 (six) hours as needed for headache, mild pain, moderate pain or cramping. (Patient not taking: Reported on 10/04/2023), Disp: , Rfl:    metoprolol tartrate (LOPRESSOR) 50 MG tablet, Take one tablet 2 hours prior to testing. (Patient not taking: Reported on 10/04/2023), Disp: 1 tablet, Rfl: 0   Physical Exam:   BP 102/72 (BP Location: Left Arm, Patient Position: Sitting, Cuff Size: Normal)   Pulse 86   Ht 5' 7 (1.702 m)   Wt 159 lb (72.1 kg)   SpO2 91%   BMI 24.90 kg/m   Salient findings:  CN II-XII intact Bilateral EAC clear and TM intact with well pneumatized middle ear spaces Weber 512: mid Rinne 512: AC > BC b/l  Anterior rhinoscopy: Septum intact; bilateral inferior turbinates without significant hypertrophy No lesions of oral cavity/oropharynx No obviously palpable neck masses/lymphadenopathy/thyromegaly No respiratory distress or stridor  Seprately Identifiable Procedures:  Prior to initiating any procedures, risks/benefits/alternatives were explained to the patient and verbal consent obtained. None today   Impression & Plans:  Joanna Haas is a 78 y.o. female with:  1. Asymmetrical hearing loss   2. Sensorineural hearing loss (SNHL) of both ears   3. Bilateral tinnitus    Noted worsening hearing. We discussed her audio - small conductive component on left ~1000 Hz but given otherwise SNHL, would recommend HA rather than any surgical intervention. As such, will hold off on CT Otherwise, for her asymmetry, MRI IAC without large lesions - see above re: radiologist message (series 3, img 13) Cleared for HA - she will contact her insurance company  regarding where she has benefits and plans to go there F/u in 1 year with audio  See below regarding exact medications prescribed this encounter including dosages and route: No orders of the defined types were placed in this encounter.     Thank you for allowing me the opportunity to care for your patient. Please do not hesitate to contact me should you have any other questions.  Sincerely, Eldora Blanch, MD Otolaryngologist (ENT), [facility] Phone: [phone] Fax: [phone]  10/05/2023, 11:31 AM   I have personally spent 31 minutes involved in face-to-face and non-face-to-face activities for this patient on the day of the visit.  Professional time spent excludes any procedures performed but includes the following activities, in addition to those noted in the documentation: preparing to see the patient (review of outside documentation and results), performing a medically appropriate examination, counseling, documenting in the electronic health record, independently interpreting results (MRI IAC).

## 2023-10-16 NOTE — Progress Notes (Signed)
 Remote PPM Transmission

## 2023-10-21 DIAGNOSIS — F325 Major depressive disorder, single episode, in full remission: Secondary | ICD-10-CM | POA: Diagnosis not present

## 2023-10-21 DIAGNOSIS — E7800 Pure hypercholesterolemia, unspecified: Secondary | ICD-10-CM | POA: Diagnosis not present

## 2023-11-08 ENCOUNTER — Ambulatory Visit (INDEPENDENT_AMBULATORY_CARE_PROVIDER_SITE_OTHER): Payer: Medicare Other

## 2023-11-08 ENCOUNTER — Ambulatory Visit: Payer: Self-pay | Admitting: Cardiology

## 2023-11-08 DIAGNOSIS — I495 Sick sinus syndrome: Secondary | ICD-10-CM

## 2023-11-08 LAB — CUP PACEART REMOTE DEVICE CHECK
Battery Remaining Longevity: 91 mo
Battery Voltage: 2.98 V
Brady Statistic AP VP Percent: 0.74 %
Brady Statistic AP VS Percent: 98.76 %
Brady Statistic AS VP Percent: 0 %
Brady Statistic AS VS Percent: 0.5 %
Brady Statistic RA Percent Paced: 99.78 %
Brady Statistic RV Percent Paced: 0.74 %
Date Time Interrogation Session: 20250918021252
Implantable Lead Connection Status: 753985
Implantable Lead Connection Status: 753985
Implantable Lead Implant Date: 20190625
Implantable Lead Implant Date: 20190625
Implantable Lead Location: 753859
Implantable Lead Location: 753860
Implantable Lead Model: 3830
Implantable Lead Model: 5076
Implantable Pulse Generator Implant Date: 20190625
Lead Channel Impedance Value: 304 Ohm
Lead Channel Impedance Value: 361 Ohm
Lead Channel Impedance Value: 399 Ohm
Lead Channel Impedance Value: 456 Ohm
Lead Channel Pacing Threshold Amplitude: 0.375 V
Lead Channel Pacing Threshold Amplitude: 1.25 V
Lead Channel Pacing Threshold Pulse Width: 0.4 ms
Lead Channel Pacing Threshold Pulse Width: 0.4 ms
Lead Channel Sensing Intrinsic Amplitude: 1 mV
Lead Channel Sensing Intrinsic Amplitude: 1 mV
Lead Channel Sensing Intrinsic Amplitude: 3 mV
Lead Channel Sensing Intrinsic Amplitude: 3 mV
Lead Channel Setting Pacing Amplitude: 1.5 V
Lead Channel Setting Pacing Amplitude: 2.5 V
Lead Channel Setting Pacing Pulse Width: 1 ms
Lead Channel Setting Sensing Sensitivity: 1.2 mV
Zone Setting Status: 755011
Zone Setting Status: 755011

## 2023-11-13 NOTE — Progress Notes (Signed)
 Remote PPM Transmission

## 2023-11-15 DIAGNOSIS — R109 Unspecified abdominal pain: Secondary | ICD-10-CM | POA: Diagnosis not present

## 2023-11-15 DIAGNOSIS — Z860100 Personal history of colon polyps, unspecified: Secondary | ICD-10-CM | POA: Diagnosis not present

## 2023-11-15 DIAGNOSIS — K219 Gastro-esophageal reflux disease without esophagitis: Secondary | ICD-10-CM | POA: Diagnosis not present

## 2023-11-15 DIAGNOSIS — B9681 Helicobacter pylori [H. pylori] as the cause of diseases classified elsewhere: Secondary | ICD-10-CM | POA: Diagnosis not present

## 2023-11-20 DIAGNOSIS — F325 Major depressive disorder, single episode, in full remission: Secondary | ICD-10-CM | POA: Diagnosis not present

## 2023-11-20 DIAGNOSIS — E7800 Pure hypercholesterolemia, unspecified: Secondary | ICD-10-CM | POA: Diagnosis not present

## 2023-12-21 DIAGNOSIS — F325 Major depressive disorder, single episode, in full remission: Secondary | ICD-10-CM | POA: Diagnosis not present

## 2023-12-21 DIAGNOSIS — E7800 Pure hypercholesterolemia, unspecified: Secondary | ICD-10-CM | POA: Diagnosis not present

## 2024-01-20 DIAGNOSIS — E7800 Pure hypercholesterolemia, unspecified: Secondary | ICD-10-CM | POA: Diagnosis not present

## 2024-01-20 DIAGNOSIS — F325 Major depressive disorder, single episode, in full remission: Secondary | ICD-10-CM | POA: Diagnosis not present

## 2024-02-07 ENCOUNTER — Ambulatory Visit: Payer: Medicare Other

## 2024-02-07 DIAGNOSIS — I495 Sick sinus syndrome: Secondary | ICD-10-CM | POA: Diagnosis not present

## 2024-02-08 LAB — CUP PACEART REMOTE DEVICE CHECK
Battery Remaining Longevity: 88 mo
Battery Voltage: 2.98 V
Brady Statistic AP VP Percent: 0.63 %
Brady Statistic AP VS Percent: 98.62 %
Brady Statistic AS VP Percent: 0 %
Brady Statistic AS VS Percent: 0.74 %
Brady Statistic RA Percent Paced: 99.72 %
Brady Statistic RV Percent Paced: 0.64 %
Date Time Interrogation Session: 20251217212752
Implantable Lead Connection Status: 753985
Implantable Lead Connection Status: 753985
Implantable Lead Implant Date: 20190625
Implantable Lead Implant Date: 20190625
Implantable Lead Location: 753859
Implantable Lead Location: 753860
Implantable Lead Model: 3830
Implantable Lead Model: 5076
Implantable Pulse Generator Implant Date: 20190625
Lead Channel Impedance Value: 304 Ohm
Lead Channel Impedance Value: 361 Ohm
Lead Channel Impedance Value: 399 Ohm
Lead Channel Impedance Value: 437 Ohm
Lead Channel Pacing Threshold Amplitude: 0.25 V
Lead Channel Pacing Threshold Amplitude: 1.125 V
Lead Channel Pacing Threshold Pulse Width: 0.4 ms
Lead Channel Pacing Threshold Pulse Width: 0.4 ms
Lead Channel Sensing Intrinsic Amplitude: 0.75 mV
Lead Channel Sensing Intrinsic Amplitude: 0.75 mV
Lead Channel Sensing Intrinsic Amplitude: 3.5 mV
Lead Channel Sensing Intrinsic Amplitude: 3.5 mV
Lead Channel Setting Pacing Amplitude: 1.5 V
Lead Channel Setting Pacing Amplitude: 2.5 V
Lead Channel Setting Pacing Pulse Width: 1 ms
Lead Channel Setting Sensing Sensitivity: 1.2 mV
Zone Setting Status: 755011
Zone Setting Status: 755011

## 2024-02-10 NOTE — Progress Notes (Signed)
 Remote PPM Transmission

## 2024-02-20 ENCOUNTER — Ambulatory Visit: Payer: Self-pay | Admitting: Cardiovascular Disease
